# Patient Record
Sex: Male | Born: 1945
Health system: Southern US, Community
[De-identification: ages and names within clinical notes are randomized; demographics above are authoritative.]

## PROBLEM LIST (undated history)

## (undated) DIAGNOSIS — I10 Essential (primary) hypertension: Secondary | ICD-10-CM

---

## 2020-02-19 ENCOUNTER — Encounter (HOSPITAL_BASED_OUTPATIENT_CLINIC_OR_DEPARTMENT_OTHER): Payer: Self-pay | Admitting: Emergency Medicine

## 2020-02-19 ENCOUNTER — Emergency Department (HOSPITAL_BASED_OUTPATIENT_CLINIC_OR_DEPARTMENT_OTHER): Payer: BC Managed Care – PPO

## 2020-02-19 ENCOUNTER — Inpatient Hospital Stay (HOSPITAL_BASED_OUTPATIENT_CLINIC_OR_DEPARTMENT_OTHER)
Admission: EM | Admit: 2020-02-19 | Discharge: 2020-02-23 | DRG: 482 | Disposition: A | Discharge status: Skilled Nursing Facility | Payer: BC Managed Care – PPO | Attending: Internal Medicine | Admitting: Internal Medicine

## 2020-02-19 ENCOUNTER — Other Ambulatory Visit: Payer: Self-pay

## 2020-02-19 DIAGNOSIS — S72142A Displaced intertrochanteric fracture of left femur, initial encounter for closed fracture: Secondary | ICD-10-CM | POA: Diagnosis not present

## 2020-02-19 DIAGNOSIS — S72002A Fracture of unspecified part of neck of left femur, initial encounter for closed fracture: Secondary | ICD-10-CM | POA: Diagnosis not present

## 2020-02-19 DIAGNOSIS — Z8781 Personal history of (healed) traumatic fracture: Secondary | ICD-10-CM

## 2020-02-19 DIAGNOSIS — K5900 Constipation, unspecified: Secondary | ICD-10-CM | POA: Diagnosis not present

## 2020-02-19 DIAGNOSIS — W01198A Fall on same level from slipping, tripping and stumbling with subsequent striking against other object, initial encounter: Secondary | ICD-10-CM | POA: Diagnosis present

## 2020-02-19 DIAGNOSIS — Z419 Encounter for procedure for purposes other than remedying health state, unspecified: Secondary | ICD-10-CM

## 2020-02-19 DIAGNOSIS — I10 Essential (primary) hypertension: Secondary | ICD-10-CM | POA: Diagnosis present

## 2020-02-19 DIAGNOSIS — Y93H2 Activity, gardening and landscaping: Secondary | ICD-10-CM

## 2020-02-19 DIAGNOSIS — Z20822 Contact with and (suspected) exposure to covid-19: Secondary | ICD-10-CM | POA: Diagnosis present

## 2020-02-19 HISTORY — DX: Essential (primary) hypertension: I10

## 2020-02-19 LAB — CBC WITH DIFFERENTIAL/PLATELET
Abs Immature Granulocytes: 0.04 10*3/uL (ref 0.00–0.07)
Basophils Absolute: 0 10*3/uL (ref 0.0–0.1)
Basophils Relative: 0 %
Eosinophils Absolute: 0 10*3/uL (ref 0.0–0.5)
Eosinophils Relative: 0 %
HCT: 43.3 % (ref 39.0–52.0)
Hemoglobin: 14.2 g/dL (ref 13.0–17.0)
Immature Granulocytes: 0 %
Lymphocytes Relative: 8 %
Lymphs Abs: 0.8 10*3/uL (ref 0.7–4.0)
MCH: 31 pg (ref 26.0–34.0)
MCHC: 32.8 g/dL (ref 30.0–36.0)
MCV: 94.5 fL (ref 80.0–100.0)
Monocytes Absolute: 0.5 10*3/uL (ref 0.1–1.0)
Monocytes Relative: 5 %
Neutro Abs: 8.7 10*3/uL — ABNORMAL HIGH (ref 1.7–7.7)
Neutrophils Relative %: 87 %
Platelets: 158 10*3/uL (ref 150–400)
RBC: 4.58 MIL/uL (ref 4.22–5.81)
RDW: 12.4 % (ref 11.5–15.5)
WBC: 10.1 10*3/uL (ref 4.0–10.5)
nRBC: 0 % (ref 0.0–0.2)

## 2020-02-19 LAB — RESPIRATORY PANEL BY RT PCR (FLU A&B, COVID)
Influenza A by PCR: NEGATIVE
Influenza B by PCR: NEGATIVE
SARS Coronavirus 2 by RT PCR: NEGATIVE

## 2020-02-19 LAB — BASIC METABOLIC PANEL
Anion gap: 8 (ref 5–15)
BUN: 17 mg/dL (ref 8–23)
CO2: 27 mmol/L (ref 22–32)
Calcium: 9 mg/dL (ref 8.9–10.3)
Chloride: 106 mmol/L (ref 98–111)
Creatinine, Ser: 1.06 mg/dL (ref 0.61–1.24)
GFR calc Af Amer: >60 mL/min (ref >60)
GFR calc non Af Amer: >60 mL/min (ref >60)
Glucose, Bld: 133 mg/dL — ABNORMAL HIGH (ref 70–99)
Potassium: 4.3 mmol/L (ref 3.5–5.1)
Sodium: 141 mmol/L (ref 135–145)

## 2020-02-19 MED ORDER — NEBIVOLOL HCL 10 MG PO TABS
10.0000 mg | ORAL_TABLET | Freq: Every day | ORAL | Status: DC
  Administered 2020-02-21 – 2020-02-23 (×3): 10 mg via ORAL
  Filled 2020-02-19 (×4): qty 1

## 2020-02-19 MED ORDER — FENTANYL CITRATE (PF) 100 MCG/2ML IJ SOLN
100.0000 ug | Freq: Once | INTRAMUSCULAR | Status: AC
  Administered 2020-02-19: 100 ug via INTRAVENOUS
  Filled 2020-02-19: qty 2

## 2020-02-19 NOTE — Progress Notes (Signed)
Orthopedics consulted due to left intertrochanteric femur fracture found on x-ray. Will plan for transfer from [facility] to [facility] for surgical fixation. Ok to eat this evening, but NPO after midnight. Plan for IM nail with Dr. Dion Saucier tomorrow afternoon at [facility]. Full consult to follow upon arrival to [facility].   Janine Ores, PA-C

## 2020-02-19 NOTE — ED Provider Notes (Signed)
[facility] EMERGENCY DEPARTMENT Provider Note   CSN: [id number] Arrival date & time: 02/19/20  1955     History Chief Complaint  Patient presents with  . Fall    Ovide Dusek is a 74 y.o. male.  74yo M w/ h/o HTN who p/w L hip pain after a fall. This afternoon just PTA, he was pushing a wheelbarrow which fell over and caused him to fall onto his left side. he bumped his head but did not lose consciousness.  He reports moderate, constant pain in his left hip and radiation into his upper thigh.  He denies any head or neck pain or any other injuries from the fall.  No numbness.  He has not been able to walk since the injury.  He denies any anticoagulant use.  The history is provided by the patient.  Fall       Past Medical History:  Diagnosis Date  . Hypertension     Patient Active Problem List   Diagnosis Date Noted  . Closed left hip fracture (HCC) 02/19/2020    History reviewed. No pertinent surgical history.     No family history on file.  Social History   Tobacco Use  . Smoking status: Never Smoker  . Smokeless tobacco: Never Used  Substance Use Topics  . Alcohol use: Never  . Drug use: Never    Home Medications Prior to Admission medications   Medication Sig Start Date End Date Taking? Authorizing Provider  nebivolol (BYSTOLIC) 10 MG tablet Take 10 mg by mouth daily.   Yes [provider]    Allergies    Patient has no known allergies.  Review of Systems   Review of Systems All other systems reviewed and are negative except that which was mentioned in HPI  Physical Exam Updated Vital Signs BP 128/67 (BP Location: Left Arm)   Pulse 78   Temp 97.9 F (36.6 C) (Oral)   Resp 19   Ht 6\' 3"  (1.905 m)   Wt 119.7 kg   SpO2 98%   BMI 33.00 kg/m   Physical Exam Vitals and nursing note reviewed.  Constitutional:      General: He is not in acute distress.    Appearance: He is well-developed.  HENT:     Head: Normocephalic  and atraumatic.  Eyes:     Conjunctiva/sclera: Conjunctivae normal.  Cardiovascular:     Rate and Rhythm: Normal rate and regular rhythm.     Heart sounds: Normal heart sounds. No murmur heard.   Pulmonary:     Effort: Pulmonary effort is normal.     Breath sounds: Normal breath sounds.  Chest:     Chest wall: No tenderness.  Abdominal:     General: Bowel sounds are normal. There is no distension.     Palpations: Abdomen is soft.     Tenderness: There is no abdominal tenderness.  Musculoskeletal:        General: Tenderness present.     Cervical back: Neck supple. No tenderness.     Comments: L hip internally rotated and shortened, 2+ DP pulses, normal distal sensation  Skin:    General: Skin is warm and dry.  Neurological:     Mental Status: He is alert and oriented to person, place, and time.     Comments: Fluent speech  Psychiatric:        Judgment: Judgment normal.     ED Results / Procedures / Treatments   Labs (all labs ordered are  listed, but only abnormal results are displayed) Labs Reviewed  CBC WITH DIFFERENTIAL/PLATELET - Abnormal; Notable for the following components:      Result Value   Neutro Abs 8.7 (*)    All other components within normal limits  RESPIRATORY PANEL BY RT PCR (FLU A&B, COVID)  BASIC METABOLIC PANEL    EKG None  Radiology CT Head Wo Contrast  Result Date: 02/19/2020 CLINICAL DATA:  Fall while unloading wheelbarrow EXAM: CT HEAD WITHOUT CONTRAST TECHNIQUE: Contiguous axial images were obtained from the base of the skull through the vertex without intravenous contrast. COMPARISON:  None. FINDINGS: Brain: No evidence of acute infarction, hemorrhage, hydrocephalus, extra-axial collection, visible mass lesion or mass effect. Symmetric prominence of the ventricles, cisterns and sulci compatible with parenchymal volume loss. Patchy areas of white matter hypoattenuation are most compatible with chronic microvascular angiopathy. Vascular:  Atherosclerotic calcification of the carotid siphons. No hyperdense vessel. Skull: No calvarial fracture or suspicious osseous lesion. No scalp swelling or hematoma. Sinuses/Orbits: Paranasal sinuses and mastoid air cells are predominantly clear. Included orbital structures are unremarkable. Other: None. IMPRESSION: 1. No acute intracranial abnormality. No scalp swelling or calvarial fracture. 2. Mild parenchymal volume loss and chronic microvascular angiopathy changes. Electronically Signed   By: Kreg Shropshire M.D.   On: 02/19/2020 22:26   DG Chest Port 1 View  Result Date: 02/19/2020 CLINICAL DATA:  Hip fracture after a fall EXAM: PORTABLE CHEST 1 VIEW COMPARISON:  None. FINDINGS: Shallow inspiration. Heart size and pulmonary vascularity are normal for technique. Vascular crowding or linear atelectasis in the lung bases. No consolidation or edema. No pleural effusions. No pneumothorax. Mediastinal contours appear intact. IMPRESSION: Shallow inspiration with vascular crowding or linear atelectasis in the lung bases. Electronically Signed   By: Burman Nieves M.D.   On: 02/19/2020 23:02   DG Hip Unilat With Pelvis 2-3 Views Left  Result Date: 02/19/2020 CLINICAL DATA:  Post fall with left hip pain. EXAM: DG HIP (WITH OR WITHOUT PELVIS) 2-3V LEFT COMPARISON:  None. FINDINGS: Displaced intertrochanteric left femur fracture. Fracture involves the greater trochanter with mild combination. There is proximal migration of the femoral shaft. Femoral head remains seated. Pubic rami are intact. No additional fracture of the pelvis. IMPRESSION: Displaced intertrochanteric left femur fracture. Electronically Signed   By: Narda Rutherford M.D.   On: 02/19/2020 22:28    Procedures Procedures (including critical care time)  Medications Ordered in ED Medications  nebivolol (BYSTOLIC) tablet 10 mg (has no administration in time range)  fentaNYL (SUBLIMAZE) injection 100 mcg (100 mcg Intravenous Given 02/19/20 2311)      ED Course  I have reviewed the triage vital signs and the nursing notes.  Pertinent labs & imaging results that were available during my care of the patient were reviewed by me and considered in my medical decision making (see chart for details).    MDM Rules/Calculators/A&P                          Neurovascularly intact distally.  Plain films show displaced intertrochanteric left femur fracture.  Discussed injury with orthopedics, Dr. Dion Saucier. They will likely plan to repair operatively tomorrow afternoon. Discussed admission at [facility] with [facility] hospitalist, Dr. Toniann Fail. Pt in agreement with plan.  Final Clinical Impression(s) / ED Diagnoses Final diagnoses:  None    Rx / DC Orders ED Discharge Orders    None       Tywaun Hiltner, Ambrose Finland, MD 02/19/20 2325

## 2020-02-19 NOTE — ED Triage Notes (Addendum)
Arrived via EMS, fell in drive way about 45 min ago, while unloading a wheelbarrow . BP 170/90, HR 90, R 18, O2 sat 98% RA. Having pain to left thigh area . No LOC

## 2020-02-20 ENCOUNTER — Inpatient Hospital Stay (HOSPITAL_COMMUNITY): Payer: BC Managed Care – PPO

## 2020-02-20 ENCOUNTER — Inpatient Hospital Stay (HOSPITAL_COMMUNITY): Payer: BC Managed Care – PPO | Admitting: Certified Registered Nurse Anesthetist

## 2020-02-20 ENCOUNTER — Encounter (HOSPITAL_COMMUNITY): Payer: Self-pay | Admitting: Internal Medicine

## 2020-02-20 ENCOUNTER — Encounter (HOSPITAL_COMMUNITY)
Admission: EM | Disposition: A | Discharge status: Skilled Nursing Facility | Payer: Self-pay | Source: Home / Self Care | Attending: Internal Medicine

## 2020-02-20 DIAGNOSIS — S72002A Fracture of unspecified part of neck of left femur, initial encounter for closed fracture: Secondary | ICD-10-CM | POA: Diagnosis present

## 2020-02-20 DIAGNOSIS — Z20822 Contact with and (suspected) exposure to covid-19: Secondary | ICD-10-CM | POA: Diagnosis present

## 2020-02-20 DIAGNOSIS — W01198A Fall on same level from slipping, tripping and stumbling with subsequent striking against other object, initial encounter: Secondary | ICD-10-CM | POA: Diagnosis present

## 2020-02-20 DIAGNOSIS — K5900 Constipation, unspecified: Secondary | ICD-10-CM | POA: Diagnosis not present

## 2020-02-20 DIAGNOSIS — I10 Essential (primary) hypertension: Secondary | ICD-10-CM | POA: Diagnosis present

## 2020-02-20 DIAGNOSIS — Y93H2 Activity, gardening and landscaping: Secondary | ICD-10-CM | POA: Diagnosis not present

## 2020-02-20 DIAGNOSIS — S72142A Displaced intertrochanteric fracture of left femur, initial encounter for closed fracture: Secondary | ICD-10-CM | POA: Diagnosis present

## 2020-02-20 HISTORY — PX: INTRAMEDULLARY (IM) NAIL INTERTROCHANTERIC: SHX5875

## 2020-02-20 LAB — TYPE AND SCREEN
ABO/RH(D): A POS
Antibody Screen: NEGATIVE

## 2020-02-20 LAB — ABO/RH: ABO/RH(D): A POS

## 2020-02-20 SURGERY — FIXATION, FRACTURE, INTERTROCHANTERIC, WITH INTRAMEDULLARY ROD
Anesthesia: Spinal | Laterality: Left

## 2020-02-20 MED ORDER — FENTANYL CITRATE (PF) 100 MCG/2ML IJ SOLN
INTRAMUSCULAR | Status: AC
  Filled 2020-02-20: qty 2

## 2020-02-20 MED ORDER — PHENYLEPHRINE HCL (PRESSORS) 10 MG/ML IV SOLN
INTRAVENOUS | Status: AC
  Filled 2020-02-20: qty 1

## 2020-02-20 MED ORDER — BISACODYL 10 MG RE SUPP: 10.0000 mg | Freq: Every day | RECTAL | Status: DC | PRN

## 2020-02-20 MED ORDER — CEFAZOLIN SODIUM-DEXTROSE 2-4 GM/100ML-% IV SOLN
2.0000 g | Freq: Four times a day (QID) | INTRAVENOUS | Status: AC
  Administered 2020-02-20 – 2020-02-21 (×2): 2 g via INTRAVENOUS
  Filled 2020-02-20 (×2): qty 100

## 2020-02-20 MED ORDER — HYDROMORPHONE HCL 1 MG/ML IJ SOLN: 0.2500 mg | INTRAMUSCULAR | Status: DC | PRN

## 2020-02-20 MED ORDER — ENOXAPARIN SODIUM 40 MG/0.4ML ~~LOC~~ SOLN: 40.0000 mg | SUBCUTANEOUS | 0 refills | Status: DC

## 2020-02-20 MED ORDER — PROPOFOL 1000 MG/100ML IV EMUL
INTRAVENOUS | Status: AC
  Filled 2020-02-20: qty 100

## 2020-02-20 MED ORDER — CHLORHEXIDINE GLUCONATE 4 % EX LIQD: 60.0000 mL | Freq: Once | CUTANEOUS | Status: DC

## 2020-02-20 MED ORDER — CEFAZOLIN SODIUM-DEXTROSE 2-4 GM/100ML-% IV SOLN
2.0000 g | INTRAVENOUS | Status: AC
  Administered 2020-02-20: 2 g via INTRAVENOUS

## 2020-02-20 MED ORDER — PROPOFOL 500 MG/50ML IV EMUL
INTRAVENOUS | Status: DC | PRN
  Administered 2020-02-20: 100 ug/kg/min via INTRAVENOUS

## 2020-02-20 MED ORDER — MENTHOL 3 MG MT LOZG: 1.0000 | LOZENGE | OROMUCOSAL | Status: DC | PRN

## 2020-02-20 MED ORDER — ACETAMINOPHEN 160 MG/5ML PO SOLN: 325.0000 mg | Freq: Once | ORAL | Status: DC | PRN

## 2020-02-20 MED ORDER — ACETAMINOPHEN 500 MG PO TABS
ORAL_TABLET | ORAL | Status: AC
  Administered 2020-02-20: 1000 mg via ORAL
  Filled 2020-02-20: qty 2

## 2020-02-20 MED ORDER — MEPERIDINE HCL 50 MG/ML IJ SOLN: 6.2500 mg | INTRAMUSCULAR | Status: DC | PRN

## 2020-02-20 MED ORDER — MAGNESIUM CITRATE PO SOLN: 1.0000 | Freq: Once | ORAL | Status: DC | PRN

## 2020-02-20 MED ORDER — PROPOFOL 10 MG/ML IV BOLUS
INTRAVENOUS | Status: AC
  Filled 2020-02-20: qty 20

## 2020-02-20 MED ORDER — EPHEDRINE 5 MG/ML INJ
INTRAVENOUS | Status: AC
  Filled 2020-02-20: qty 10

## 2020-02-20 MED ORDER — PROPOFOL 500 MG/50ML IV EMUL
INTRAVENOUS | Status: AC
  Filled 2020-02-20: qty 50

## 2020-02-20 MED ORDER — POLYETHYLENE GLYCOL 3350 17 G PO PACK: 17.0000 g | PACK | Freq: Every day | ORAL | Status: DC | PRN

## 2020-02-20 MED ORDER — ACETAMINOPHEN 500 MG PO TABS
500.0000 mg | ORAL_TABLET | Freq: Four times a day (QID) | ORAL | Status: AC
  Administered 2020-02-20 – 2020-02-21 (×4): 500 mg via ORAL
  Filled 2020-02-20 (×4): qty 1

## 2020-02-20 MED ORDER — LACTATED RINGERS IV SOLN: INTRAVENOUS | Status: DC

## 2020-02-20 MED ORDER — DEXAMETHASONE SODIUM PHOSPHATE 10 MG/ML IJ SOLN
INTRAMUSCULAR | Status: AC
  Filled 2020-02-20: qty 1

## 2020-02-20 MED ORDER — PHENOL 1.4 % MT LIQD: 1.0000 | OROMUCOSAL | Status: DC | PRN

## 2020-02-20 MED ORDER — FERROUS SULFATE 325 (65 FE) MG PO TABS
325.0000 mg | ORAL_TABLET | Freq: Three times a day (TID) | ORAL | Status: DC
  Administered 2020-02-20 – 2020-02-23 (×9): 325 mg via ORAL
  Filled 2020-02-20 (×9): qty 1

## 2020-02-20 MED ORDER — ACETAMINOPHEN 500 MG PO TABS: 1000.0000 mg | ORAL_TABLET | Freq: Once | ORAL | Status: AC

## 2020-02-20 MED ORDER — 0.9 % SODIUM CHLORIDE (POUR BTL) OPTIME
TOPICAL | Status: DC | PRN
  Administered 2020-02-20: 1000 mL

## 2020-02-20 MED ORDER — MORPHINE SULFATE (PF) 2 MG/ML IV SOLN: 0.5000 mg | INTRAVENOUS | Status: DC | PRN

## 2020-02-20 MED ORDER — PROPOFOL 10 MG/ML IV BOLUS
INTRAVENOUS | Status: DC | PRN
  Administered 2020-02-20 (×3): 20 mg via INTRAVENOUS

## 2020-02-20 MED ORDER — DEXAMETHASONE SODIUM PHOSPHATE 10 MG/ML IJ SOLN
INTRAMUSCULAR | Status: DC | PRN
  Administered 2020-02-20: 4 mg via INTRAVENOUS

## 2020-02-20 MED ORDER — CEFAZOLIN SODIUM-DEXTROSE 2-4 GM/100ML-% IV SOLN
INTRAVENOUS | Status: AC
  Filled 2020-02-20: qty 100

## 2020-02-20 MED ORDER — HYDROCODONE-ACETAMINOPHEN 7.5-325 MG PO TABS
1.0000 | ORAL_TABLET | ORAL | Status: DC | PRN
  Administered 2020-02-21 – 2020-02-22 (×3): 2 via ORAL
  Administered 2020-02-23 (×2): 1 via ORAL
  Filled 2020-02-20: qty 2
  Filled 2020-02-20 (×2): qty 1
  Filled 2020-02-20 (×2): qty 2

## 2020-02-20 MED ORDER — ACETAMINOPHEN 325 MG PO TABS: 325.0000 mg | ORAL_TABLET | Freq: Once | ORAL | Status: DC | PRN

## 2020-02-20 MED ORDER — FENTANYL CITRATE (PF) 100 MCG/2ML IJ SOLN
INTRAMUSCULAR | Status: DC | PRN
Start: 2020-02-20 — End: 2020-02-20
  Administered 2020-02-20: 100 ug via INTRAVENOUS

## 2020-02-20 MED ORDER — LIDOCAINE 2% (20 MG/ML) 5 ML SYRINGE
INTRAMUSCULAR | Status: AC
  Filled 2020-02-20: qty 5

## 2020-02-20 MED ORDER — PHENYLEPHRINE 40 MCG/ML (10ML) SYRINGE FOR IV PUSH (FOR BLOOD PRESSURE SUPPORT)
PREFILLED_SYRINGE | INTRAVENOUS | Status: DC | PRN
  Administered 2020-02-20: 80 ug via INTRAVENOUS

## 2020-02-20 MED ORDER — ONDANSETRON HCL 4 MG/2ML IJ SOLN
INTRAMUSCULAR | Status: AC
  Filled 2020-02-20: qty 2

## 2020-02-20 MED ORDER — METHOCARBAMOL 500 MG PO TABS
500.0000 mg | ORAL_TABLET | Freq: Four times a day (QID) | ORAL | Status: DC | PRN
  Administered 2020-02-22 – 2020-02-23 (×2): 500 mg via ORAL
  Filled 2020-02-20 (×2): qty 1

## 2020-02-20 MED ORDER — AMISULPRIDE (ANTIEMETIC) 5 MG/2ML IV SOLN: 10.0000 mg | Freq: Once | INTRAVENOUS | Status: DC | PRN

## 2020-02-20 MED ORDER — ACETAMINOPHEN 325 MG PO TABS: 325.0000 mg | ORAL_TABLET | Freq: Four times a day (QID) | ORAL | Status: DC | PRN

## 2020-02-20 MED ORDER — DOCUSATE SODIUM 100 MG PO CAPS
100.0000 mg | ORAL_CAPSULE | Freq: Two times a day (BID) | ORAL | Status: DC
  Administered 2020-02-20 – 2020-02-23 (×6): 100 mg via ORAL
  Filled 2020-02-20 (×6): qty 1

## 2020-02-20 MED ORDER — ONDANSETRON HCL 4 MG/2ML IJ SOLN: 4.0000 mg | Freq: Four times a day (QID) | INTRAMUSCULAR | Status: DC | PRN

## 2020-02-20 MED ORDER — ENOXAPARIN SODIUM 40 MG/0.4ML ~~LOC~~ SOLN: 40.0000 mg | SUBCUTANEOUS | Status: DC

## 2020-02-20 MED ORDER — ACETAMINOPHEN 10 MG/ML IV SOLN: 1000.0000 mg | Freq: Once | INTRAVENOUS | Status: DC | PRN

## 2020-02-20 MED ORDER — ENSURE PRE-SURGERY PO LIQD
296.0000 mL | Freq: Once | ORAL | Status: DC
  Filled 2020-02-20: qty 296

## 2020-02-20 MED ORDER — ALUM & MAG HYDROXIDE-SIMETH 200-200-20 MG/5ML PO SUSP: 30.0000 mL | ORAL | Status: DC | PRN

## 2020-02-20 MED ORDER — POTASSIUM CHLORIDE IN NACL 20-0.45 MEQ/L-% IV SOLN
INTRAVENOUS | Status: DC
  Filled 2020-02-20: qty 1000

## 2020-02-20 MED ORDER — EPHEDRINE SULFATE-NACL 50-0.9 MG/10ML-% IV SOSY
PREFILLED_SYRINGE | INTRAVENOUS | Status: DC | PRN
  Administered 2020-02-20 (×2): 10 mg via INTRAVENOUS

## 2020-02-20 MED ORDER — BUPIVACAINE IN DEXTROSE 0.75-8.25 % IT SOLN
INTRATHECAL | Status: DC | PRN
  Administered 2020-02-20: 1.7 mL via INTRATHECAL

## 2020-02-20 MED ORDER — ONDANSETRON HCL 4 MG PO TABS: 4.0000 mg | ORAL_TABLET | Freq: Four times a day (QID) | ORAL | Status: DC | PRN

## 2020-02-20 MED ORDER — HYDROCODONE-ACETAMINOPHEN 5-325 MG PO TABS: 1.0000 | ORAL_TABLET | Freq: Four times a day (QID) | ORAL | Status: DC | PRN

## 2020-02-20 MED ORDER — MORPHINE SULFATE (PF) 4 MG/ML IV SOLN
4.0000 mg | Freq: Once | INTRAVENOUS | Status: AC
  Administered 2020-02-20: 4 mg via INTRAVENOUS
  Filled 2020-02-20: qty 1

## 2020-02-20 MED ORDER — PHENYLEPHRINE HCL-NACL 10-0.9 MG/250ML-% IV SOLN
INTRAVENOUS | Status: DC | PRN
  Administered 2020-02-20: 50 ug/min via INTRAVENOUS

## 2020-02-20 MED ORDER — POVIDONE-IODINE 10 % EX SWAB
2.0000 "application " | Freq: Once | CUTANEOUS | Status: AC
  Administered 2020-02-20: 2 via TOPICAL

## 2020-02-20 MED ORDER — HYDROCODONE-ACETAMINOPHEN 5-325 MG PO TABS
1.0000 | ORAL_TABLET | ORAL | Status: DC | PRN
  Administered 2020-02-21: 1 via ORAL
  Filled 2020-02-20: qty 1

## 2020-02-20 MED ORDER — SENNA 8.6 MG PO TABS
1.0000 | ORAL_TABLET | Freq: Two times a day (BID) | ORAL | Status: DC
  Administered 2020-02-20 – 2020-02-21 (×2): 8.6 mg via ORAL
  Filled 2020-02-20 (×2): qty 1

## 2020-02-20 MED ORDER — LACTATED RINGERS IV SOLN: INTRAVENOUS | Status: DC | PRN

## 2020-02-20 MED ORDER — DOCUSATE SODIUM 100 MG PO CAPS: 100.0000 mg | ORAL_CAPSULE | Freq: Two times a day (BID) | ORAL | Status: DC

## 2020-02-20 MED ORDER — LIDOCAINE HCL (CARDIAC) PF 100 MG/5ML IV SOSY
PREFILLED_SYRINGE | INTRAVENOUS | Status: DC | PRN
  Administered 2020-02-20: 60 mg via INTRAVENOUS

## 2020-02-20 MED ORDER — ENOXAPARIN SODIUM 40 MG/0.4ML ~~LOC~~ SOLN
40.0000 mg | SUBCUTANEOUS | Status: DC
  Administered 2020-02-21 – 2020-02-23 (×3): 40 mg via SUBCUTANEOUS
  Filled 2020-02-20 (×3): qty 0.4

## 2020-02-20 MED ORDER — PHENYLEPHRINE 40 MCG/ML (10ML) SYRINGE FOR IV PUSH (FOR BLOOD PRESSURE SUPPORT)
PREFILLED_SYRINGE | INTRAVENOUS | Status: AC
  Filled 2020-02-20: qty 20

## 2020-02-20 MED ORDER — ONDANSETRON HCL 4 MG/2ML IJ SOLN
INTRAMUSCULAR | Status: DC | PRN
  Administered 2020-02-20: 4 mg via INTRAVENOUS

## 2020-02-20 MED ORDER — METHOCARBAMOL 1000 MG/10ML IJ SOLN
500.0000 mg | Freq: Four times a day (QID) | INTRAVENOUS | Status: DC | PRN
  Filled 2020-02-20: qty 5

## 2020-02-20 SURGICAL SUPPLY — 38 items
BENZOIN TINCTURE PRP APPL 2/3 (GAUZE/BANDAGES/DRESSINGS) ×3 IMPLANT
BIT DRILL CANN LG 4.3MM (BIT) ×1 IMPLANT
BNDG COHESIVE 6X5 TAN STRL LF (GAUZE/BANDAGES/DRESSINGS) ×3 IMPLANT
CLOSURE STERI-STRIP 1/2X4 (GAUZE/BANDAGES/DRESSINGS) ×1
CLSR STERI-STRIP ANTIMIC 1/2X4 (GAUZE/BANDAGES/DRESSINGS) ×2 IMPLANT
COVER PERINEAL POST (MISCELLANEOUS) ×3 IMPLANT
COVER SURGICAL LIGHT HANDLE (MISCELLANEOUS) ×3 IMPLANT
COVER WAND RF STERILE (DRAPES) ×3 IMPLANT
DRAPE STERI IOBAN 125X83 (DRAPES) ×3 IMPLANT
DRILL BIT CANN LG 4.3MM (BIT) ×3
DRSG MEPILEX BORDER 4X4 (GAUZE/BANDAGES/DRESSINGS) ×6 IMPLANT
DRSG MEPILEX BORDER 4X8 (GAUZE/BANDAGES/DRESSINGS) IMPLANT
DRSG PAD ABDOMINAL 8X10 ST (GAUZE/BANDAGES/DRESSINGS) ×3 IMPLANT
DURAPREP 26ML APPLICATOR (WOUND CARE) ×3 IMPLANT
ELECT REM PT RETURN 15FT ADLT (MISCELLANEOUS) ×3 IMPLANT
GAUZE XEROFORM 5X9 LF (GAUZE/BANDAGES/DRESSINGS) ×3 IMPLANT
GLOVE BIO SURGEON STRL SZ 6.5 (GLOVE) ×4 IMPLANT
GLOVE BIO SURGEON STRL SZ7.5 (GLOVE) ×3 IMPLANT
GLOVE BIO SURGEONS STRL SZ 6.5 (GLOVE) ×2
GLOVE BIOGEL PI IND STRL 7.0 (GLOVE) ×1 IMPLANT
GLOVE BIOGEL PI INDICATOR 7.0 (GLOVE) ×2
GLOVE INDICATOR 8.0 STRL GRN (GLOVE) ×3 IMPLANT
GOWN STRL REUS W/ TWL LRG LVL3 (GOWN DISPOSABLE) ×2 IMPLANT
GOWN STRL REUS W/TWL LRG LVL3 (GOWN DISPOSABLE) ×4
GUIDEPIN VERSANAIL DSP 3.2X444 (ORTHOPEDIC DISPOSABLE SUPPLIES) ×3 IMPLANT
HFN LAG SCREW 10.5MM X 115MM (Orthopedic Implant) ×3 IMPLANT
MANIFOLD NEPTUNE II (INSTRUMENTS) ×3 IMPLANT
NAIL HIP FRACT 130D 11X180 (Screw) ×3 IMPLANT
NS IRRIG 1000ML POUR BTL (IV SOLUTION) ×3 IMPLANT
PACK GENERAL/GYN (CUSTOM PROCEDURE TRAY) ×3 IMPLANT
PAD ARMBOARD 7.5X6 YLW CONV (MISCELLANEOUS) ×6 IMPLANT
SCREW BONE CORTICAL 5.0X36 (Screw) ×3 IMPLANT
SUT VIC AB 0 CT1 27 (SUTURE) ×2
SUT VIC AB 0 CT1 27XBRD ANBCTR (SUTURE) ×1 IMPLANT
SUT VIC AB 3-0 SH 27 (SUTURE) ×4
SUT VIC AB 3-0 SH 27X BRD (SUTURE) ×2 IMPLANT
TOWEL OR 17X26 10 PK STRL BLUE (TOWEL DISPOSABLE) ×3 IMPLANT
WATER STERILE IRR 1000ML POUR (IV SOLUTION) ×6 IMPLANT

## 2020-02-20 NOTE — Anesthesia Preprocedure Evaluation (Addendum)
Anesthesia Evaluation  Patient identified by MRN, date of birth, ID band Patient awake    Reviewed: Allergy & Precautions, NPO status , Patient's Chart, lab work & pertinent test results  Airway Mallampati: II  TM Distance: >3 FB Neck ROM: Full    Dental  (+) Edentulous Upper, Edentulous Lower   Pulmonary neg pulmonary ROS,    breath sounds clear to auscultation       Cardiovascular hypertension, Pt. on home beta blockers  Rhythm:Regular Rate:Normal     Neuro/Psych negative neurological ROS  negative psych ROS   GI/Hepatic negative GI ROS, Neg liver ROS,   Endo/Other  negative endocrine ROS  Renal/GU negative Renal ROS     Musculoskeletal negative musculoskeletal ROS (+)   Abdominal Normal abdominal exam  (+)   Peds  Hematology negative hematology ROS (+)   Anesthesia Other Findings   Reproductive/Obstetrics                            Anesthesia Physical Anesthesia Plan  ASA: II  Anesthesia Plan: Spinal   Post-op Pain Management:    Induction: Intravenous  PONV Risk Score and Plan: 2 and Ondansetron and Propofol infusion  Airway Management Planned: Natural Airway and Simple Face Mask  Additional Equipment: None  Intra-op Plan:   Post-operative Plan:   Informed Consent: I have reviewed the patients History and Physical, chart, labs and discussed the procedure including the risks, benefits and alternatives for the proposed anesthesia with the patient or authorized representative who has indicated his/her understanding and acceptance.       Plan Discussed with: CRNA  Anesthesia Plan Comments: (Lab Results      Component                Value               Date                      WBC                      10.1                02/19/2020                HGB                      14.2                02/19/2020                HCT                      43.3                 02/19/2020                MCV                      94.5                02/19/2020                PLT                      158  02/19/2020           )       Anesthesia Quick Evaluation

## 2020-02-20 NOTE — Consult Note (Signed)
ORTHOPAEDIC CONSULTATION  REQUESTING PHYSICIAN: Teddy Spike, DO  Chief Complaint: Left hip pain after fall  HPI: Spencer [name] is a 74 y.o. male who complains of left hip pain after mechanical fall yesterday onto concrete.  He was brought to med [facility] and then transferred to [place] [facility].  Pain is worse with movement, better with rest, located around the left hip.  Denies any other injuries.  Previously denies any pain in the hip.  He lives with his sister who can help take care of him.  Pain is rated as severe.  Past Medical History:  Diagnosis Date  . Hypertension    History reviewed. No pertinent surgical history. Social History   Socioeconomic History  . Marital status: Single    Spouse name: Not on file  . Number of children: Not on file  . Years of education: Not on file  . Highest education level: Not on file  Occupational History  . Not on file  Tobacco Use  . Smoking status: Never Smoker  . Smokeless tobacco: Never Used  Substance and Sexual Activity  . Alcohol use: Never  . Drug use: Never  . Sexual activity: Not on file  Other Topics Concern  . Not on file  Social History Narrative  . Not on file   Social Determinants of Health   Financial Resource Strain:   . Difficulty of Paying Living Expenses: Not on file  Food Insecurity:   . Worried About Programme researcher, broadcasting/film/video in the Last Year: Not on file  . Ran Out of Food in the Last Year: Not on file  Transportation Needs:   . Lack of Transportation (Medical): Not on file  . Lack of Transportation (Non-Medical): Not on file  Physical Activity:   . Days of Exercise per Week: Not on file  . Minutes of Exercise per Session: Not on file  Stress:   . Feeling of Stress : Not on file  Social Connections:   . Frequency of Communication with Friends and Family: Not on file  . Frequency of Social Gatherings with Friends and Family: Not on file  . Attends Religious Services: Not on file  . Active Member of  Clubs or Organizations: Not on file  . Attends Banker Meetings: Not on file  . Marital Status: Not on file   History reviewed. No pertinent family history. No Known Allergies   Positive ROS: All other systems have been reviewed and were otherwise negative with the exception of those mentioned in the HPI and as above.  Physical Exam: General: Alert, no acute distress Cardiovascular: No pedal edema Respiratory: No cyanosis, no use of accessory musculature GI: No organomegaly, abdomen is soft and non-tender Skin: No lesions in the area of chief complaint Neurologic: Sensation intact distally Psychiatric: Patient is competent for consent with normal mood and affect Lymphatic: No axillary or cervical lymphadenopathy  MUSCULOSKELETAL: Left hip EHL and FHL are intact.  Sensation intact distally, positive logroll  Assessment: Active Problems:   Closed left hip fracture (HCC)   Plan: Left hip intramedullary nail fixation.  The risks benefits and alternatives were discussed with the patient including but not limited to the risks of nonoperative treatment, versus surgical intervention including infection, bleeding, nerve injury, malunion, nonunion, the need for revision surgery, hardware prominence, hardware failure, the need for hardware removal, blood clots, cardiopulmonary complications, morbidity, mortality, among others, and they were willing to proceed.      Eulas Post, MD Cell (  [phone]   02/20/2020 1:56 PM

## 2020-02-20 NOTE — Anesthesia Postprocedure Evaluation (Signed)
Anesthesia Post Note  Patient: Farley Crooker  Procedure(s) Performed: INTRAMEDULLARY (IM) NAIL INTERTROCHANTRIC (Left )     Patient location during evaluation: PACU Anesthesia Type: Spinal Level of consciousness: oriented and awake and alert Pain management: pain level controlled Vital Signs Assessment: post-procedure vital signs reviewed and stable Respiratory status: spontaneous breathing, respiratory function stable and patient connected to nasal cannula oxygen Cardiovascular status: blood pressure returned to baseline and stable Postop Assessment: no headache, no backache, no apparent nausea or vomiting and spinal receding Anesthetic complications: no   No complications documented.  Last Vitals:  Vitals:   02/20/20 1654 02/20/20 1705  BP: 118/69 116/67  Pulse: (!) 50 (!) 49  Resp: (!) 23 16  Temp: (!) 36.3 C 36.5 C  SpO2: 96% 96%    Last Pain:  Vitals:   02/20/20 1705  TempSrc: Oral  PainSc:                  Shelton Silvas

## 2020-02-20 NOTE — Op Note (Signed)
DATE OF SURGERY:  02/20/2020  TIME: 3:29 PM  PATIENT NAME:  Spencer [name]  AGE: 74 y.o.  PRE-OPERATIVE DIAGNOSIS:  Left intertrochanteric hip fracture  POST-OPERATIVE DIAGNOSIS:  SAME  PROCEDURE:  INTRAMEDULLARY (IM) NAIL INTERTROCHANTRIC  SURGEON:  Eulas Post  ASSISTANT:  Janine Ores, PA-C, present and scrubbed throughout the case, critical for assistance with exposure, retraction, instrumentation, and closure.  ANESTHESIA: Spinal  OPERATIVE IMPLANTS: Biomet Affixus size short 11 femoral nail with a cephalomedullary lag screw for the femoral head, and a size 36 distal interlocking bolt.  UNIQUE ASPECTS OF THE CASE:  The head wanted to be in varus, but ultimately had center center position with more traction.    ESTIMATED BLOOD LOSS:  PREOPERATIVE INDICATIONS:  Spencer [name] is a 74 y.o. year old who fell and suffered a hip fracture. He was brought into the ER and then admitted and optimized and then elected for surgical intervention.    The risks benefits and alternatives were discussed with the patient including but not limited to the risks of nonoperative treatment, versus surgical intervention including infection, bleeding, nerve injury, malunion, nonunion, hardware prominence, hardware failure, need for hardware removal, blood clots, cardiopulmonary complications, morbidity, mortality, among others, and they were willing to proceed.    OPERATIVE PROCEDURE:  The patient was brought to the operating room and placed in the supine position. Anesthesia was administered. He was placed on the fracture table.  Closed reduction was performed under C-arm guidance.  Time out was then performed after sterile prep and drape. He received preoperative antibiotics.  Incision was made proximal to the greater trochanter. A guidewire was placed in the appropriate position. Confirmation was made on AP and lateral views.  The above-named nail was opened. I opened the proximal femur with  a reamer. I then placed the nail by hand down. I did not need to ream the femur.  Once the nail was completely seated, I placed a guidepin into the femoral head into the center center position. I measured the length, and then reamed the lateral cortex and up into the head. I then placed the cephalomedullary screw. Slight compression was applied. Anatomic fixation achieved. Bone quality was mediocre.  I then secured the proximal interlocking bolt, and locked the nail distally using the jig.  I took final C-arm pictures AP and lateral.   Anatomic reconstruction was achieved, and the wounds were irrigated copiously and closed with Vicryl followed by Steri-Strips and sterile gauze for the skin. The patient was awakened and returned to PACU in stable and satisfactory condition. There were no complications and the patient tolerated the procedure well.  He will be weightbearing as tolerated, and will be on Lovenox  for a period of four weeks after discharge.   Teryl Lucy, M.D.

## 2020-02-20 NOTE — ED Notes (Signed)
Per patient request, Spoke with Lawernce Keas, sister of patient, to update that pt is being transported at this time to [facility]

## 2020-02-20 NOTE — Anesthesia Procedure Notes (Signed)
Spinal  Patient location during procedure: OR Start time: 02/20/2020 2:09 PM End time: 02/20/2020 2:11 PM Staffing Performed: anesthesiologist  Anesthesiologist: Shelton Silvas, MD Preanesthetic Checklist Completed: patient identified, IV checked, site marked, risks and benefits discussed, surgical consent, monitors and equipment checked, pre-op evaluation and timeout performed Spinal Block Patient position: left lateral decubitus Prep: DuraPrep and site prepped and draped Location: L3-4 Injection technique: single-shot Needle Needle type: Pencan  Needle gauge: 24 G Needle length: 10 cm Needle insertion depth: 10 cm Additional Notes Patient tolerated well. No immediate complications.

## 2020-02-20 NOTE — H&P (Signed)
History and Physical    Steffen Hase GGY:[id number] DOB: [birth year] DOA: 02/19/2020  PCP: [facility]  Patient coming from: Home  Chief Complaint: fall, hip pain  HPI: Tarek Cravens is a 74 y.o. male with medical history significant of HTN. Presents with hip pain after falls. He states he was loading his wheelbarrow yesterday. As he threw a bag into the wheelbarrow, it overturned and pulled him with it. In the process he landed on his left side and immediately had pain. He denies LOC, but did bump his head. He remembers the entire fall. He reported inability to walk since the fall. He denies any additional alleviating or aggravating factors. He called for transport to [facility] ED.   ED Course: He was found to have an intertrochanteric fracture. Ortho was consulted and rec'd transfer to [facility]. [facility] was called for admission.   Review of Systems: Denies CP, palpitations, dyspnea, syncopal episodes, seizure-like episodes, HA, visual changes, dizziness. Review of systems is otherwise negative for all not mentioned in HPI.   Past Medical History:  Diagnosis Date  . Hypertension     History reviewed. No pertinent surgical history.   reports that he has never smoked. He has never used smokeless tobacco. He reports that he does not drink alcohol and does not use drugs.  No Known Allergies  No family history on file.  Prior to Admission medications   Medication Sig Start Date End Date Taking? Authorizing Provider  nebivolol (BYSTOLIC) 10 MG tablet Take 10 mg by mouth daily.   Yes [provider]    Physical Exam: Vitals:   02/20/20 0805 02/20/20 0900 02/20/20 0932 02/20/20 1035  BP: (!) 141/76  134/78 (!) 149/84  Pulse: 64 69 70 71  Resp: 16  20 16   Temp:   98.7 F (37.1 C)   TempSrc:      SpO2: 98% 98% 96% 97%  Weight:      Height:        General: 74 y.o. male resting in bed in NAD Eyes: PERRL, normal sclera ENMT: Nares patent w/o discharge, orophaynx clear,  dentition normal, ears w/o discharge/lesions/ulcers Neck: Supple, trachea midline Cardiovascular: RRR, +S1, S2, no m/g/r, equal pulses throughout Respiratory: CTABL, no w/r/r, normal WOB GI: BS+, NDNT, no masses noted, no organomegaly noted MSK: No e/c/c, limited ROM LLE owing to hip pain Skin: No rashes, bruises, ulcerations noted Neuro: A&O x 3, no focal deficits Psyc: Appropriate interaction and affect, calm/cooperative  Labs on Admission: I have personally reviewed following labs and imaging studies  CBC: Recent Labs  Lab 02/19/20 2259  WBC 10.1  NEUTROABS 8.7*  HGB 14.2  HCT 43.3  MCV 94.5  PLT 158   Basic Metabolic Panel: Recent Labs  Lab 02/19/20 2259  NA 141  K 4.3  CL 106  CO2 27  GLUCOSE 133*  BUN 17  CREATININE 1.06  CALCIUM 9.0   GFR: Estimated Creatinine Clearance: 87.9 mL/min (by C-G formula based on SCr of 1.06 mg/dL). Liver Function Tests: No results for input(s): AST, ALT, ALKPHOS, BILITOT, PROT, ALBUMIN in the last 168 hours. No results for input(s): LIPASE, AMYLASE in the last 168 hours. No results for input(s): AMMONIA in the last 168 hours. Coagulation Profile: No results for input(s): INR, PROTIME in the last 168 hours. Cardiac Enzymes: No results for input(s): CKTOTAL, CKMB, CKMBINDEX, TROPONINI in the last 168 hours. BNP (last 3 results) No results for input(s): PROBNP in the last 8760 hours. HbA1C: No results for input(s):  HGBA1C in the last 72 hours. CBG: No results for input(s): GLUCAP in the last 168 hours. Lipid Profile: No results for input(s): CHOL, HDL, LDLCALC, TRIG, CHOLHDL, LDLDIRECT in the last 72 hours. Thyroid Function Tests: No results for input(s): TSH, T4TOTAL, FREET4, T3FREE, THYROIDAB in the last 72 hours. Anemia Panel: No results for input(s): VITAMINB12, FOLATE, FERRITIN, TIBC, IRON, RETICCTPCT in the last 72 hours. Urine analysis: No results found for: COLORURINE, APPEARANCEUR, LABSPEC, PHURINE, GLUCOSEU, HGBUR,  BILIRUBINUR, KETONESUR, PROTEINUR, UROBILINOGEN, NITRITE, LEUKOCYTESUR  Radiological Exams on Admission: CT Head Wo Contrast  Result Date: 02/19/2020 CLINICAL DATA:  Fall while unloading wheelbarrow EXAM: CT HEAD WITHOUT CONTRAST TECHNIQUE: Contiguous axial images were obtained from the base of the skull through the vertex without intravenous contrast. COMPARISON:  None. FINDINGS: Brain: No evidence of acute infarction, hemorrhage, hydrocephalus, extra-axial collection, visible mass lesion or mass effect. Symmetric prominence of the ventricles, cisterns and sulci compatible with parenchymal volume loss. Patchy areas of white matter hypoattenuation are most compatible with chronic microvascular angiopathy. Vascular: Atherosclerotic calcification of the carotid siphons. No hyperdense vessel. Skull: No calvarial fracture or suspicious osseous lesion. No scalp swelling or hematoma. Sinuses/Orbits: Paranasal sinuses and mastoid air cells are predominantly clear. Included orbital structures are unremarkable. Other: None. IMPRESSION: 1. No acute intracranial abnormality. No scalp swelling or calvarial fracture. 2. Mild parenchymal volume loss and chronic microvascular angiopathy changes. Electronically Signed   By: Kreg Shropshire M.D.   On: 02/19/2020 22:26   DG Chest Port 1 View  Result Date: 02/19/2020 CLINICAL DATA:  Hip fracture after a fall EXAM: PORTABLE CHEST 1 VIEW COMPARISON:  None. FINDINGS: Shallow inspiration. Heart size and pulmonary vascularity are normal for technique. Vascular crowding or linear atelectasis in the lung bases. No consolidation or edema. No pleural effusions. No pneumothorax. Mediastinal contours appear intact. IMPRESSION: Shallow inspiration with vascular crowding or linear atelectasis in the lung bases. Electronically Signed   By: Burman Nieves M.D.   On: 02/19/2020 23:02   DG Hip Unilat With Pelvis 2-3 Views Left  Result Date: 02/19/2020 CLINICAL DATA:  Post fall with left  hip pain. EXAM: DG HIP (WITH OR WITHOUT PELVIS) 2-3V LEFT COMPARISON:  None. FINDINGS: Displaced intertrochanteric left femur fracture. Fracture involves the greater trochanter with mild combination. There is proximal migration of the femoral shaft. Femoral head remains seated. Pubic rami are intact. No additional fracture of the pelvis. IMPRESSION: Displaced intertrochanteric left femur fracture. Electronically Signed   By: Narda Rutherford M.D.   On: 02/19/2020 22:28   Assessment/Plan Fall Displaced intertrochanteric left femur fracture     - admit to inpt, med surg     - orthopedics to see, planning procedure per note last night     - PT/OT after procedure     - NPO until eval'd by ortho     - XR w/ Displaced intertrochanteric left femur fracture; CTH clear  HTN     - bystolic resumed  DVT prophylaxis: lovenox  Code Status: FULL  Family Communication: None at bedside  Consults called: Orthopedics called by EDP Admission status: Inpatient d/t pending surgical procedure and currently unsafe for discharge  Status is: Observation  The patient will require care spanning > 2 midnights and should be moved to inpatient because: Unsafe d/c plan  Dispo: The patient is from: Home              Anticipated d/c is to: SNF  Anticipated d/c date is: 3 days              Patient currently is not medically stable to d/c.  Teddy Spike DO [facility]  If 7PM-7AM, please contact night-coverage [email or web address]  02/20/2020, 11:34 AM

## 2020-02-20 NOTE — Discharge Instructions (Signed)
Diet: As you were doing prior to hospitalization   Shower:  May shower but keep the wounds dry, use an occlusive plastic wrap, NO SOAKING IN TUB.  If the bandage gets wet, change with a clean dry gauze.  If you have a splint on, leave the splint in place and keep the splint dry with a plastic bag.  Dressing:  You may change your dressing 3-5 days after surgery, unless you have a splint.  If you have a splint, then just leave the splint in place and we will change your bandages during your first follow-up appointment.    If you had hand or foot surgery, we will plan to remove your stitches in about 2 weeks in the office.  For all other surgeries, there are sticky tapes (steri-strips) on your wounds and all the stitches are absorbable.  Leave the steri-strips in place when changing your dressings, they will peel off with time, usually 2-3 weeks.  Activity:  Increase activity slowly as tolerated, but follow the weight bearing instructions below.  The rules on driving is that you can not be taking narcotics while you drive, and you must feel in control of the vehicle.    Weight Bearing:  Can weight bear as tolerated on left leg  To prevent constipation: you may use a stool softener such as -  Colace (over the counter) 100 mg by mouth twice a day  Drink plenty of fluids (prune juice may be helpful) and high fiber foods Miralax (over the counter) for constipation as needed.    Itching:  If you experience itching with your medications, try taking only a single pain pill, or even half a pain pill at a time.  You may take up to 10 pain pills per day, and you can also use benadryl over the counter for itching or also to help with sleep.   Precautions:  If you experience chest pain or shortness of breath - call 911 immediately for transfer to the hospital emergency department!!  If you develop a fever greater that 101 F, purulent drainage from wound, increased redness or drainage from wound, or calf pain  -- Call the office at [phone]                                                Follow- Up Appointment:  Please call for an appointment to be seen in 2 weeks [place] - [phone]

## 2020-02-20 NOTE — Transfer of Care (Signed)
Immediate Anesthesia Transfer of Care Note  Patient: Spencer [name]  Procedure(s) Performed: INTRAMEDULLARY (IM) NAIL INTERTROCHANTRIC (Left )  Patient Location: PACU  Anesthesia Type:Spinal  Level of Consciousness: drowsy  Airway & Oxygen Therapy: Patient Spontanous Breathing and Patient connected to face mask oxygen  Post-op Assessment: Report given to RN and Post -op Vital signs reviewed and stable  Post vital signs: Reviewed and stable  Last Vitals:  Vitals Value Taken Time  BP 105/57 02/20/20 1601  Temp    Pulse 81 02/20/20 1604  Resp 29 02/20/20 1604  SpO2 100 % 02/20/20 1604  Vitals shown include unvalidated device data.  Last Pain:  Vitals:   02/20/20 1336  TempSrc:   PainSc: 5       Patients Stated Pain Goal: 3 (02/20/20 1336)  Complications: No complications documented.

## 2020-02-21 ENCOUNTER — Encounter (HOSPITAL_COMMUNITY): Payer: Self-pay | Admitting: Orthopedic Surgery

## 2020-02-21 DIAGNOSIS — S72002A Fracture of unspecified part of neck of left femur, initial encounter for closed fracture: Secondary | ICD-10-CM

## 2020-02-21 LAB — BASIC METABOLIC PANEL
Anion gap: 7 (ref 5–15)
BUN: 16 mg/dL (ref 8–23)
CO2: 25 mmol/L (ref 22–32)
Calcium: 8.6 mg/dL — ABNORMAL LOW (ref 8.9–10.3)
Chloride: 105 mmol/L (ref 98–111)
Creatinine, Ser: 0.99 mg/dL (ref 0.61–1.24)
GFR calc Af Amer: >60 mL/min (ref >60)
GFR calc non Af Amer: >60 mL/min (ref >60)
Glucose, Bld: 134 mg/dL — ABNORMAL HIGH (ref 70–99)
Potassium: 4.6 mmol/L (ref 3.5–5.1)
Sodium: 137 mmol/L (ref 135–145)

## 2020-02-21 LAB — CBC
HCT: 35.5 % — ABNORMAL LOW (ref 39.0–52.0)
Hemoglobin: 11.7 g/dL — ABNORMAL LOW (ref 13.0–17.0)
MCH: 31.4 pg (ref 26.0–34.0)
MCHC: 33 g/dL (ref 30.0–36.0)
MCV: 95.2 fL (ref 80.0–100.0)
Platelets: 143 10*3/uL — ABNORMAL LOW (ref 150–400)
RBC: 3.73 MIL/uL — ABNORMAL LOW (ref 4.22–5.81)
RDW: 12.5 % (ref 11.5–15.5)
WBC: 10.5 10*3/uL (ref 4.0–10.5)
nRBC: 0 % (ref 0.0–0.2)

## 2020-02-21 MED ORDER — SENNA 8.6 MG PO TABS
2.0000 | ORAL_TABLET | Freq: Two times a day (BID) | ORAL | Status: DC
  Administered 2020-02-21 – 2020-02-23 (×5): 17.2 mg via ORAL
  Filled 2020-02-21 (×5): qty 2

## 2020-02-21 MED ORDER — ENSURE ENLIVE PO LIQD
237.0000 mL | Freq: Two times a day (BID) | ORAL | Status: DC
  Administered 2020-02-22 – 2020-02-23 (×4): 237 mL via ORAL

## 2020-02-21 MED ORDER — POLYETHYLENE GLYCOL 3350 17 G PO PACK
17.0000 g | PACK | Freq: Every day | ORAL | Status: DC
  Administered 2020-02-21 – 2020-02-22 (×2): 17 g via ORAL
  Filled 2020-02-21 (×2): qty 1

## 2020-02-21 NOTE — Progress Notes (Signed)
Subjective: 1 Day Post-Op s/p Procedure(s): INTRAMEDULLARY (IM) NAIL INTERTROCHANTRIC  Doing well. Pain is moderate this morning. Has not yet had a bowel movement. Denies paraesthesias, chest pain, SOB, Calf pain. No nausea/vomiting. No other complaints.   Objective:  PE: VITALS:   Vitals:   02/20/20 2129 02/21/20 0118 02/21/20 0550 02/21/20 0550  BP: 123/72 131/72 138/80 138/80  Pulse: 81 68 76 79  Resp: 16 16 16 16   Temp: 98.1 F (36.7 C) 98.2 F (36.8 C) 98.7 F (37.1 C) 98.7 F (37.1 C)  TempSrc: Oral Oral Oral Oral  SpO2: 99% 95% 92% 92%  Weight:      Height:       General: Alert, oriented, sitting up in bed, in no acute distress GI: abdomen soft, non-tender MSK: LLE - EHL and FHL intact. Able to move all toes of left foot. Distal sensation intact. Surgical dressings intact with mild drainage. 2+DP pulse. No edema at hip or knee.   LABS  Results for orders placed or performed during the hospital encounter of 02/19/20 (from the past 24 hour(s))  Type and screen [facility]     Status: None   Collection Time: 02/20/20  1:34 PM  Result Value Ref Range   ABO/RH(D) A POS    Antibody Screen NEG    Sample Expiration      02/23/2020,2359 Performed at [facility], [address], [place], [place]   ABO/Rh     Status: None   Collection Time: 02/20/20  1:41 PM  Result Value Ref Range   ABO/RH(D)      A POS Performed at [facility], [address], [place], [place]   CBC     Status: Abnormal   Collection Time: 02/21/20  3:20 AM  Result Value Ref Range   WBC 10.5 4.0 - 10.5 K/uL   RBC 3.73 (L) 4.22 - 5.81 MIL/uL   Hemoglobin 11.7 (L) 13.0 - 17.0 g/dL   HCT 02/23/20 (L) 39 - 52 %   MCV 95.2 80.0 - 100.0 fL   MCH 31.4 26.0 - 34.0 pg   MCHC 33.0 30.0 - 36.0 g/dL   RDW 81.0 17.5 - 10.2 %   Platelets 143 (L) 150 - 400 K/uL   nRBC 0.0 0.0 - 0.2 %  Basic metabolic panel     Status:  Abnormal   Collection Time: 02/21/20  3:20 AM  Result Value Ref Range   Sodium 137 135 - 145 mmol/L   Potassium 4.6 3.5 - 5.1 mmol/L   Chloride 105 98 - 111 mmol/L   CO2 25 22 - 32 mmol/L   Glucose, Bld 134 (H) 70 - 99 mg/dL   BUN 16 8 - 23 mg/dL   Creatinine, Ser 02/23/20 0.61 - 1.24 mg/dL   Calcium 8.6 (L) 8.9 - 10.3 mg/dL   GFR calc non Af Amer >60 >60 mL/min   GFR calc Af Amer >60 >60 mL/min   Anion gap 7 5 - 15    CT Head Wo Contrast  Result Date: 02/19/2020 CLINICAL DATA:  Fall while unloading wheelbarrow EXAM: CT HEAD WITHOUT CONTRAST TECHNIQUE: Contiguous axial images were obtained from the base of the skull through the vertex without intravenous contrast. COMPARISON:  None. FINDINGS: Brain: No evidence of acute infarction, hemorrhage, hydrocephalus, extra-axial collection, visible mass lesion or mass effect. Symmetric prominence of the ventricles, cisterns and sulci compatible with parenchymal volume loss. Patchy areas of white matter  hypoattenuation are most compatible with chronic microvascular angiopathy. Vascular: Atherosclerotic calcification of the carotid siphons. No hyperdense vessel. Skull: No calvarial fracture or suspicious osseous lesion. No scalp swelling or hematoma. Sinuses/Orbits: Paranasal sinuses and mastoid air cells are predominantly clear. Included orbital structures are unremarkable. Other: None. IMPRESSION: 1. No acute intracranial abnormality. No scalp swelling or calvarial fracture. 2. Mild parenchymal volume loss and chronic microvascular angiopathy changes. Electronically Signed   By: Kreg Shropshire M.D.   On: 02/19/2020 22:26   Pelvis Portable  Result Date: 02/20/2020 CLINICAL DATA:  ORIF left intertrochanteric fracture. EXAM: PORTABLE PELVIS 1-2 VIEWS COMPARISON:  Concurrent hip exam reported separately. FINDINGS: Intramedullary nail with trans trochanteric screw partially included in the field of view fixating intertrochanteric femur fracture. Recent  postsurgical change includes air in the adjacent soft tissues. No other fracture of the pelvis or acute findings. IMPRESSION: ORIF left intertrochanteric femur fracture. No immediate postoperative complication. Electronically Signed   By: Narda Rutherford M.D.   On: 02/20/2020 16:49   DG Chest Port 1 View  Result Date: 02/19/2020 CLINICAL DATA:  Hip fracture after a fall EXAM: PORTABLE CHEST 1 VIEW COMPARISON:  None. FINDINGS: Shallow inspiration. Heart size and pulmonary vascularity are normal for technique. Vascular crowding or linear atelectasis in the lung bases. No consolidation or edema. No pleural effusions. No pneumothorax. Mediastinal contours appear intact. IMPRESSION: Shallow inspiration with vascular crowding or linear atelectasis in the lung bases. Electronically Signed   By: Burman Nieves M.D.   On: 02/19/2020 23:02   DG C-Arm 1-60 Min-No Report  Result Date: 02/20/2020 Fluoroscopy was utilized by the requesting physician.  No radiographic interpretation.   DG Hip Port Unilat With Pelvis 1V Left  Result Date: 02/20/2020 CLINICAL DATA:  Postop hip surgery today. EXAM: DG HIP (WITH OR WITHOUT PELVIS) 1V PORT LEFT COMPARISON:  Radiograph yesterday. FINDINGS: Intramedullary rod with trans trochanteric and distal locking screw fixation of intertrochanteric proximal femur fracture. The fracture is in improved alignment compared to preoperative imaging. No periprosthetic lucency. Recent postsurgical change includes air and edema in the soft tissues. IMPRESSION: Intramedullary rod and screw fixation of intertrochanteric proximal femur fracture, in improved alignment compared to preoperative imaging. No immediate postoperative complication Electronically Signed   By: Narda Rutherford M.D.   On: 02/20/2020 16:45   DG HIP OPERATIVE UNILAT W OR W/O PELVIS LEFT  Result Date: 02/20/2020 CLINICAL DATA:  Left intramedullary nail. EXAM: OPERATIVE LEFT HIP (WITH PELVIS IF PERFORMED) 2 VIEWS TECHNIQUE:  Fluoroscopic spot image(s) were submitted for interpretation post-operatively. COMPARISON:  Preoperative radiograph. FINDINGS: Four fluoroscopic spot images obtained in the operating room. Intramedullary nail with trans trochanteric and distal locking screw traverse intertrochanteric femur fracture. Total fluoroscopy time 1 minutes 12 seconds. Total dose 31.545 mGy. IMPRESSION: Fluoroscopic spot views after ORIF left intertrochanteric fracture. Electronically Signed   By: Narda Rutherford M.D.   On: 02/20/2020 16:48   DG Hip Unilat With Pelvis 2-3 Views Left  Result Date: 02/19/2020 CLINICAL DATA:  Post fall with left hip pain. EXAM: DG HIP (WITH OR WITHOUT PELVIS) 2-3V LEFT COMPARISON:  None. FINDINGS: Displaced intertrochanteric left femur fracture. Fracture involves the greater trochanter with mild combination. There is proximal migration of the femoral shaft. Femoral head remains seated. Pubic rami are intact. No additional fracture of the pelvis. IMPRESSION: Displaced intertrochanteric left femur fracture. Electronically Signed   By: Narda Rutherford M.D.   On: 02/19/2020 22:28    Assessment/Plan: Active Problems:   Closed left hip fracture (HCC)  1 Day Post-Op s/p Procedure(s): INTRAMEDULLARY (IM) NAIL INTERTROCHANTRIC - doing well Weightbearing: WBAT LLE, up with PT, does not walk with assistive device at baseline Insicional and dressing care: Reinforce dressings as needed, multiple ABD's applied post operatively due to significant drainage. Will plan to remove and change dressings tomorrow AM.  VTE prophylaxis: Lovenox 40mg  qd x 30 days Pain control: continue current regimen Follow - up plan: 2 weeks with Dr. Dispo: pending PT eval, patient lives at home with sister  Contact information:   Weekdays 8-5 01-26-2006, PA-C [phone] A fter hours and holidays please check [email or web address] for group call information for [facility]  [phone] 02/21/2020, 7:27 AM

## 2020-02-21 NOTE — Progress Notes (Signed)
Initial Nutrition Assessment  DOCUMENTATION CODES:   Obesity unspecified  INTERVENTION:   -Ensure Enlive po BID, each supplement provides 350 kcal and 20 grams of protein  NUTRITION DIAGNOSIS:   Increased nutrient needs related to hip fracture, post-op healing as evidenced by estimated needs.  GOAL:   Patient will meet greater than or equal to 90% of their needs  MONITOR:   PO intake, Supplement acceptance, Labs, Weight trends, I & O's  REASON FOR ASSESSMENT:   Consult Hip fracture protocol  ASSESSMENT:   74 y.o. male with medical history significant of HTN. Presents with hip pain after falls.  9/28: s/p  INTRAMEDULLARY (IM) NAIL INTERTROCHANTRIC  Patient consumed 85% of breakfast this morning. Still awaiting BM.  Will order Ensure supplements given healing needs. Per PT note, recommending CIR at discharge.  No weight history available PTA.  Labs reviewed. Medications: Colace, Ferrous sulfate, Miralax, Senokot  NUTRITION - FOCUSED PHYSICAL EXAM:  No depletions noted.  Diet Order:   Diet Order            Diet regular Room service appropriate? Yes; Fluid consistency: Thin  Diet effective now                 EDUCATION NEEDS:   No education needs have been identified at this time  Skin:  Skin Assessment: Skin Integrity Issues: Skin Integrity Issues:: Incisions Incisions: left hip  Last BM:  9/26  Height:   Ht Readings from Last 1 Encounters:  02/20/20 6\' 3"  (1.905 m)    Weight:   Wt Readings from Last 1 Encounters:  02/20/20 119.7 kg   BMI:  Body mass index is 33 kg/m.  Estimated Nutritional Needs:   Kcal:  2200-2400  Protein:  100-110g  Fluid:  2.2L/day  02/22/20, MS, RD, LDN Inpatient Clinical Dietitian Contact information available via Amion

## 2020-02-21 NOTE — Evaluation (Signed)
Physical Therapy Evaluation Patient Details Name: Spencer [name] MRN: [id number] DOB: [birth year] Today's Date: 02/21/2020   History of Present Illness  Patient is 74 y.o. male s/p Lt IM nail after mechanical fall at home. PMH significant for HTN.  Clinical Impression  Spencer [name] is a 74 y.o. male POD 1 s/p Lt IM nail due to fall and intertrochanteric fracture. Patient reports independence with mobility at baseline. Patient is now limited by functional impairments (see PT problem list below) and requires min assist for bed mobility and Min-Mod assist for transfers and gait with RW. Patient was able to ambulate ~20 feet with RW and min assist and was limited some by hip pain. Patient instructed in exercise to facilitate ROM and circulation to manage edema. Patient will benefit from continued skilled PT interventions to address impairments and progress towards PLOF. Acute PT will follow to progress mobility and stair training in preparation for safe discharge home.     Follow Up Recommendations CIR    Equipment Recommendations  Rolling walker with 5" wheels (TBA)    Recommendations for Other Services OT consult;Rehab consult     Precautions / Restrictions Precautions Precautions: Fall Restrictions Weight Bearing Restrictions: No Other Position/Activity Restrictions: WBAT      Mobility  Bed Mobility Overal bed mobility: Needs Assistance Bed Mobility: Supine to Sit     Supine to sit: HOB elevated;Min assist     General bed mobility comments: min assist and cues to raise trunk with useo f bed rail and assist for LE's off EOB.  Transfers Overall transfer level: Needs assistance Equipment used: Rolling walker (2 wheeled) Transfers: Sit to/from Stand Sit to Stand: Mod assist;+2 safety/equipment;From elevated surface         General transfer comment: cues for safe hand placement/technique with RW, mod assist for power up and min assist to steady once standing.    Ambulation/Gait Ambulation/Gait assistance: Min assist;+2 safety/equipment Gait Distance (Feet): 20 Feet Assistive device: Rolling walker (2 wheeled) Gait Pattern/deviations: Step-to pattern;Decreased stride length;Decreased stance time - left;Decreased step length - left;Decreased weight shift to left;Antalgic;Trunk flexed Gait velocity: decr   General Gait Details: cues for safe step pattern and proximity to RW. cues to improve posture in standing. Min assist to steady intermittently and chair follow for safety.   Stairs            Wheelchair Mobility    Modified Rankin (Stroke Patients Only)       Balance Overall balance assessment: Needs assistance Sitting-balance support: Feet supported Sitting balance-Leahy Scale: Fair     Standing balance support: During functional activity;Bilateral upper extremity supported Standing balance-Leahy Scale: Poor                               Pertinent Vitals/Pain Pain Assessment: 0-10 Pain Score: 5  Pain Location: Lt hip Pain Descriptors / Indicators: Aching;Grimacing;Discomfort Pain Intervention(s): Limited activity within patient's tolerance;Monitored during session;Repositioned;Ice applied;Premedicated before session    Home Living Family/patient expects to be discharged to:: Private residence Living Arrangements: Other relatives (pt's sister) Available Help at Discharge: Family Type of Home: House Home Access: Stairs to enter Entrance Stairs-Rails: Right Entrance Stairs-Number of Steps: 1 Home Layout: One level Home Equipment: Cane - single point;Grab bars - tub/shower;Hand held shower head      Prior Function Level of Independence: Independent         Comments: pt typically ambulates with no device. He still drives, does his  shopping, cleaning, cooking, and is independent for ADL's.     Hand Dominance   Dominant Hand: Right    Extremity/Trunk Assessment   Upper Extremity Assessment Upper  Extremity Assessment: Generalized weakness;Defer to OT evaluation    Lower Extremity Assessment Lower Extremity Assessment: Generalized weakness;LLE deficits/detail LLE Deficits / Details: knee ROM limited and pt unable to fully extend (lacking 5-10 degrees) LLE Sensation: WNL LLE Coordination: WNL    Cervical / Trunk Assessment Cervical / Trunk Assessment: Kyphotic  Communication   Communication: No difficulties  Cognition Arousal/Alertness: Awake/alert Behavior During Therapy: WFL for tasks assessed/performed Overall Cognitive Status: Within Functional Limits for tasks assessed                                        General Comments      Exercises General Exercises - Lower Extremity Ankle Circles/Pumps: AROM;Both;20 reps;Seated Quad Sets: AROM;Both;5 reps (pt with poor quad activation, pt using glutes/hamstrings)   Assessment/Plan    PT Assessment Patient needs continued PT services  PT Problem List Decreased strength;Decreased range of motion;Decreased activity tolerance;Decreased balance;Decreased mobility;Decreased knowledge of use of DME;Decreased safety awareness;Decreased knowledge of precautions;Pain;Obesity       PT Treatment Interventions DME instruction;Gait training;Stair training;Functional mobility training;Therapeutic activities;Therapeutic exercise;Balance training;Patient/family education    PT Goals (Current goals can be found in the Care Plan section)  Acute Rehab PT Goals Patient Stated Goal: get back to being independent PT Goal Formulation: With patient Time For Goal Achievement: 02/28/20 Potential to Achieve Goals: Good    Frequency Min 4X/week   Barriers to discharge        Co-evaluation               AM-PAC PT "6 Clicks" Mobility  Outcome Measure Help needed turning from your back to your side while in a flat bed without using bedrails?: A Little Help needed moving from lying on your back to sitting on the side of  a flat bed without using bedrails?: A Little Help needed moving to and from a bed to a chair (including a wheelchair)?: A Lot Help needed standing up from a chair using your arms (e.g., wheelchair or bedside chair)?: A Lot Help needed to walk in hospital room?: A Little Help needed climbing 3-5 steps with a railing? : A Lot 6 Click Score: 15    End of Session Equipment Utilized During Treatment: Gait belt Activity Tolerance: Patient tolerated treatment well Patient left: in chair;with call bell/phone within reach;with chair alarm set Nurse Communication: Mobility status PT Visit Diagnosis: Muscle weakness (generalized) (M62.81);Difficulty in walking, not elsewhere classified (R26.2);History of falling (Z91.81);Unsteadiness on feet (R26.81)    Time: 3903-0092 PT Time Calculation (min) (ACUTE ONLY): 22 min   Charges:   PT Evaluation $PT Eval Low Complexity: 1 Low          Wynn Maudlin, DPT Acute Rehabilitation Services  Office [phone] Pager [phone]  02/21/2020 12:17 PM

## 2020-02-21 NOTE — Progress Notes (Signed)
PROGRESS NOTE  Spencer [name] JJH:[id number] DOB: 15-Jul-1945 DOA: 02/19/2020 PCP: [facility]  HPI/Recap of past 24 hours: HPI: Spencer [name] is a 74 y.o. male with medical history significant of HTN. Presents with hip pain after falls. He states he was loading his wheelbarrow yesterday. As he threw a bag into the wheelbarrow, it overturned and pulled him with it. In the process he landed on his left side and immediately had pain. He denies LOC, but did bump his head. He remembers the entire fall. He reported inability to walk since the fall. He denies any additional alleviating or aggravating factors. He called for transport to [facility] ED.   ED Course: He was found to have an intertrochanteric fracture. Ortho was consulted and rec'd transfer to [facility]. [facility] was called for admission.   POD#1 post left hip surgical repair Post-Op s/p Procedure(s): INTRAMEDULLARY (IM) NAIL INTERTROCHANTRIC on 02/20/20.  02/21/20: Reports constipation for a few days.  Added additional laxatives and stool softeners.  Left hip pain is well controlled.  Assessment/Plan: Active Problems:   Closed left hip fracture (HCC)  Closed left hip fracture s/p left hip intramedullary nail fixation by Dr. Dion Saucier on 02/20/20 PT recs CIR Pain control Recommendation per ortho: Weightbearing: WBAT LLE, up with PT, does not walk with assistive device at baseline Insicional and dressing care: Reinforce dressings as needed, multiple ABD's applied post operatively due to significant drainage. Will plan to remove and change dressings tomorrow AM.  VTE prophylaxis: Lovenox 40mg  qd x 30 days Pain control: continue current regimen Follow - up plan: 2 weeks with Dr. Dispo: pending PT eval, patient lives at home with sister  Essential HTN BP is stable Continue home bystolic Monitor vital signs  DVT prophylaxis: lovenox SQ daily Code Status: FULL  Family Communication: None at bedside  Consults called: Orthopedics  called by EDP  Status is: Inpatient    Dispo: The patient is from:Home              Anticipated d/c is to:CIR              Anticipated d/c date is: 02/22/20              Patient currently is not stable for dc, ongoing management of left hip fracture post surgical repair.        Objective: Vitals:   02/21/20 0118 02/21/20 0550 02/21/20 0550 02/21/20 1348  BP: 131/72 138/80 138/80 121/70  Pulse: 68 76 79 78  Resp: 16 16 16    Temp: 98.2 F (36.8 C) 98.7 F (37.1 C) 98.7 F (37.1 C) 98.1 F (36.7 C)  TempSrc: Oral Oral Oral   SpO2: 95% 92% 92% 97%  Weight:      Height:        Intake/Output Summary (Last 24 hours) at 02/21/2020 1713 Last data filed at 02/21/2020 1500 Gross per 24 hour  Intake 2065 ml  Output 1300 ml  Net 765 ml   Filed Weights   02/19/20 2004 02/20/20 1336  Weight: 119.7 kg 119.7 kg    Exam:  . General: 74 y.o. year-old male well developed well nourished in no acute distress.  Alert and oriented x3. . Cardiovascular: Regular rate and rhythm with no rubs or gallops.  No thyromegaly or JVD noted.   02/22/20 Respiratory: Clear to auscultation with no wheezes or rales. Good inspiratory effort. . Abdomen: Soft nontender nondistended with normal bowel sounds x4 quadrants. . Psychiatry: Mood is appropriate for condition and setting  Data Reviewed: CBC: Recent Labs  Lab 02/19/20 2259 02/21/20 0320  WBC 10.1 10.5  NEUTROABS 8.7*  --   HGB 14.2 11.7*  HCT 43.3 35.5*  MCV 94.5 95.2  PLT 158 143*   Basic Metabolic Panel: Recent Labs  Lab 02/19/20 2259 02/21/20 0320  NA 141 137  K 4.3 4.6  CL 106 105  CO2 27 25  GLUCOSE 133* 134*  BUN 17 16  CREATININE 1.06 0.99  CALCIUM 9.0 8.6*   GFR: Estimated Creatinine Clearance: 94.1 mL/min (by C-G formula based on SCr of 0.99 mg/dL). Liver Function Tests: No results for input(s): AST, ALT, ALKPHOS, BILITOT, PROT, ALBUMIN in the last 168 hours. No results for input(s): LIPASE, AMYLASE in the last 168  hours. No results for input(s): AMMONIA in the last 168 hours. Coagulation Profile: No results for input(s): INR, PROTIME in the last 168 hours. Cardiac Enzymes: No results for input(s): CKTOTAL, CKMB, CKMBINDEX, TROPONINI in the last 168 hours. BNP (last 3 results) No results for input(s): PROBNP in the last 8760 hours. HbA1C: No results for input(s): HGBA1C in the last 72 hours. CBG: No results for input(s): GLUCAP in the last 168 hours. Lipid Profile: No results for input(s): CHOL, HDL, LDLCALC, TRIG, CHOLHDL, LDLDIRECT in the last 72 hours. Thyroid Function Tests: No results for input(s): TSH, T4TOTAL, FREET4, T3FREE, THYROIDAB in the last 72 hours. Anemia Panel: No results for input(s): VITAMINB12, FOLATE, FERRITIN, TIBC, IRON, RETICCTPCT in the last 72 hours. Urine analysis: No results found for: COLORURINE, APPEARANCEUR, LABSPEC, PHURINE, GLUCOSEU, HGBUR, BILIRUBINUR, KETONESUR, PROTEINUR, UROBILINOGEN, NITRITE, LEUKOCYTESUR Sepsis Labs: @LABRCNTIP (procalcitonin:4,lacticidven:4)  ) Recent Results (from the past 240 hour(s))  Respiratory Panel by RT PCR (Flu A&B, Covid) - Nasopharyngeal Swab     Status: None   Collection Time: 02/19/20 10:59 PM   Specimen: Nasopharyngeal Swab  Result Value Ref Range Status   SARS Coronavirus 2 by RT PCR NEGATIVE NEGATIVE Final    Comment: (NOTE) SARS-CoV-2 target nucleic acids are NOT DETECTED.  The SARS-CoV-2 RNA is generally detectable in upper respiratoy specimens during the acute phase of infection. The lowest concentration of SARS-CoV-2 viral copies this assay can detect is 131 copies/mL. A negative result does not preclude SARS-Cov-2 infection and should not be used as the sole basis for treatment or other patient management decisions. A negative result may occur with  improper specimen collection/handling, submission of specimen other than nasopharyngeal swab, presence of viral mutation(s) within the areas targeted by this assay,  and inadequate number of viral copies (<131 copies/mL). A negative result must be combined with clinical observations, patient history, and epidemiological information. The expected result is Negative.  Fact Sheet for Patients:  02/21/20  Fact Sheet for Healthcare Providers:  https://www.moore.com/  This test is no t yet approved or cleared by the https://www.young.biz/ FDA and  has been authorized for detection and/or diagnosis of SARS-CoV-2 by FDA under an Emergency Use Authorization (EUA). This EUA will remain  in effect (meaning this test can be used) for the duration of the COVID-19 declaration under Section 564(b)(1) of the Act, 21 U.S.C. section 360bbb-3(b)(1), unless the authorization is terminated or revoked sooner.     Influenza A by PCR NEGATIVE NEGATIVE Final   Influenza B by PCR NEGATIVE NEGATIVE Final    Comment: (NOTE) The Xpert Xpress SARS-CoV-2/FLU/RSV assay is intended as an aid in  the diagnosis of influenza from Nasopharyngeal swab specimens and  should not be used as a sole basis for treatment. Nasal washings  and  aspirates are unacceptable for Xpert Xpress SARS-CoV-2/FLU/RSV  testing.  Fact Sheet for Patients: https://www.moore.com/  Fact Sheet for Healthcare Providers: https://www.young.biz/  This test is not yet approved or cleared by the [place] FDA and  has been authorized for detection and/or diagnosis of SARS-CoV-2 by  FDA under an Emergency Use Authorization (EUA). This EUA will remain  in effect (meaning this test can be used) for the duration of the  Covid-19 declaration under Section 564(b)(1) of the Act, 21  U.S.C. section 360bbb-3(b)(1), unless the authorization is  terminated or revoked. Performed at [facility], [address] [place], [place] [address]       Studies: No results found.  Scheduled Meds: . docusate sodium  100 mg  Oral BID  . enoxaparin (LOVENOX) injection  40 mg Subcutaneous Q24H  . feeding supplement (ENSURE ENLIVE)  237 mL Oral BID BM  . ferrous sulfate  325 mg Oral TID PC  . nebivolol  10 mg Oral Daily  . polyethylene glycol  17 g Oral Daily  . senna  2 tablet Oral BID    Continuous Infusions: . 0.45 % NaCl with KCl 20 mEq / L Stopped (02/21/20 0831)  . methocarbamol (ROBAXIN) IV       LOS: 1 day     Darlin Drop, MD [facility] Pager [phone]  If 7PM-7AM, please contact night-coverage [email or web address] Password [facility] 02/21/2020, 5:13 PM

## 2020-02-21 NOTE — Progress Notes (Signed)
Inpatient Rehab Admissions Coordinator Note:   Per therapy recommendations, pt was screened for CIR candidacy by Estill Dooms, PT, DPT.  At this time we are recommending a CIR consult and I will place an order per our protocol.  Please contact me with questions.   Estill Dooms, PT, DPT [phone] 02/21/20 1:10 PM

## 2020-02-21 NOTE — Progress Notes (Signed)
Inpatient Rehab Admissions:  Inpatient Rehab Consult received. I spoke with patient to discuss goals and expectations of an inpatient rehab admission.  Pt is open to CIR at [facility].  States he lives with his sister and he was still working prior to admission as a Surveyor, mining.  Expect that pt would reach mod I level with brief CIR stay.  [employer] coverage with pt, and he states BCBS is primary and Medicare Part A is secondary (confirmed on passport onesource).  I will need insurance authorization so will start that today.   Signed: Estill Dooms, PT, DPT Admissions Coordinator [phone] 02/21/20  3:31 PM

## 2020-02-21 NOTE — Progress Notes (Addendum)
Physical Therapy recommends CIR. TOC staff will continue to follow this patient.

## 2020-02-22 LAB — BASIC METABOLIC PANEL
Anion gap: 7 (ref 5–15)
BUN: 18 mg/dL (ref 8–23)
CO2: 26 mmol/L (ref 22–32)
Calcium: 8.4 mg/dL — ABNORMAL LOW (ref 8.9–10.3)
Chloride: 102 mmol/L (ref 98–111)
Creatinine, Ser: 0.92 mg/dL (ref 0.61–1.24)
GFR calc Af Amer: >60 mL/min (ref >60)
GFR calc non Af Amer: >60 mL/min (ref >60)
Glucose, Bld: 106 mg/dL — ABNORMAL HIGH (ref 70–99)
Potassium: 4 mmol/L (ref 3.5–5.1)
Sodium: 135 mmol/L (ref 135–145)

## 2020-02-22 LAB — CBC
HCT: 33 % — ABNORMAL LOW (ref 39.0–52.0)
Hemoglobin: 10.8 g/dL — ABNORMAL LOW (ref 13.0–17.0)
MCH: 31.3 pg (ref 26.0–34.0)
MCHC: 32.7 g/dL (ref 30.0–36.0)
MCV: 95.7 fL (ref 80.0–100.0)
Platelets: 131 10*3/uL — ABNORMAL LOW (ref 150–400)
RBC: 3.45 MIL/uL — ABNORMAL LOW (ref 4.22–5.81)
RDW: 12.6 % (ref 11.5–15.5)
WBC: 10.7 10*3/uL — ABNORMAL HIGH (ref 4.0–10.5)
nRBC: 0 % (ref 0.0–0.2)

## 2020-02-22 MED ORDER — POLYETHYLENE GLYCOL 3350 17 G PO PACK: 17.0000 g | PACK | Freq: Every day | ORAL | 0 refills | Status: DC

## 2020-02-22 MED ORDER — BISACODYL 10 MG RE SUPP
10.0000 mg | Freq: Once | RECTAL | Status: AC
  Administered 2020-02-22: 10 mg via RECTAL
  Filled 2020-02-22: qty 1

## 2020-02-22 MED ORDER — ENSURE ENLIVE PO LIQD: 237.0000 mL | Freq: Two times a day (BID) | ORAL | 0 refills | Status: AC

## 2020-02-22 MED ORDER — FERROUS SULFATE 325 (65 FE) MG PO TABS
325.0000 mg | ORAL_TABLET | Freq: Two times a day (BID) | ORAL | 0 refills | Status: DC
Start: 2020-02-22 — End: 2020-03-22

## 2020-02-22 MED ORDER — HYDROCODONE-ACETAMINOPHEN 5-325 MG PO TABS: 1.0000 | ORAL_TABLET | Freq: Three times a day (TID) | ORAL | 0 refills | Status: AC | PRN

## 2020-02-22 MED ORDER — POLYETHYLENE GLYCOL 3350 17 G PO PACK
17.0000 g | PACK | Freq: Two times a day (BID) | ORAL | Status: DC
  Administered 2020-02-22 – 2020-02-23 (×2): 17 g via ORAL
  Filled 2020-02-22 (×2): qty 1

## 2020-02-22 MED ORDER — METHOCARBAMOL 500 MG PO TABS: 500.0000 mg | ORAL_TABLET | Freq: Four times a day (QID) | ORAL | 0 refills | Status: DC | PRN

## 2020-02-22 NOTE — Discharge Summary (Signed)
Discharge Summary  Zebedee IbaCurtis Quinlivan ZOX:[id number] DOB: July 16, 1945  PCP: [facility]  Admit date: 02/19/2020 Discharge date: 02/22/2020  Time spent: 35 minutes   Recommendations for Outpatient Follow-up:  1. Follow-up with orthopedic surgery 2. Follow-up with your primary care provider 3. Take medications as prescribed. 4. Continue PT OT with assistance and fall precautions at inpatient rehab.  Discharge Diagnoses:  Active Hospital Problems   Diagnosis Date Noted  . Closed left hip fracture (HCC) 02/19/2020    Resolved Hospital Problems  No resolved problems to display.    Discharge Condition: Stable  Diet recommendation: Resume previous diet.  Vitals:   02/22/20 0527 02/22/20 1114  BP: 128/76 126/87  Pulse: 79 84  Resp: 17 14  Temp: 99.1 F (37.3 C) 99.1 F (37.3 C)  SpO2: (!) 89% 94%    History of present illness:  WJX:BJYNWGHPI:Burlin Millsis a 74 y.o.malewith medical history significant ofHTN. Presents with hip pain after falls. He states he was loading his wheelbarrow yesterday. As he threw a bag into the wheelbarrow, it overturned and pulled him with it. In the process he landed on his left side and immediately had pain. He denies LOC, but did bump his head. He remembers the entire fall. He reported inability to walk since the fall. He denies any additional alleviating or aggravating factors. [facility]  ED Course:He was found to have an intertrochanteric fracture. Ortho was consulted and [facility]'d [facility]. [facility] was called for admission.  POD#2 post left hip surgical repair Post-Ops/p Procedure(s): INTRAMEDULLARY (IM) NAIL INTERTROCHANTRIC on 02/20/20.  02/22/20: Reports constipation for a few days.  Added additional laxatives and stool softeners.  Left hip pain is well controlled.  PT assessment recommended CIR.  Hospital Course:  Active Problems:   Closed left hip fracture (HCC)  Closed left hip fracture s/p left hip  intramedullary nail fixation by Dr. Dion SaucierLandau on 02/20/20 PT recs CIR Pain control Recommendation per ortho: Weightbearing:WBATLLE, up with PT, does not walk with assistive device at baseline Insicional and dressing care:Reinforce dressings as needed, multiple ABD's applied post operatively due to significant drainage. Will plan to remove and change dressings tomorrow AM. VTE prophylaxis:Lovenox 40mg  qdx 30 days Pain control:continue current regimen Follow - up plan:2 weekswith Dr. Dion SaucierLandau Dispo: pending PT eval, patient lives at home with sister  Essential HTN BP is stable Continue home bystolic Monitor vital signs  DVT prophylaxis:lovenoxSQ daily Code Status:FULL Family Communication:None at bedside Consults called:Orthopedic surgery.    Procedures: s/p left hip intramedullary nail fixation by Dr. Dion SaucierLandau on 02/20/20   Discharge Exam: BP 126/87 (BP Location: Left Arm)   Pulse 84   Temp 99.1 F (37.3 C) (Oral)   Resp 14   Ht 6\' 3"  (1.905 m)   Wt 119.7 kg   SpO2 94%   BMI 33.00 kg/m  . General: 74 y.o. year-old male well developed well nourished in no acute distress.  Alert and oriented x3. . Cardiovascular: Regular rate and rhythm with no rubs or gallops.  No thyromegaly or JVD noted.   Marland Kitchen. Respiratory: Clear to auscultation with no wheezes or rales. Good inspiratory effort. . Abdomen: Soft nontender nondistended with normal bowel sounds x4 quadrants. . Musculoskeletal: No lower extremity edema. 2/4 pulses in all 4 extremities. Marland Kitchen. Psychiatry: Mood is appropriate for condition and setting  Discharge Instructions You were cared for by a hospitalist during your hospital stay. If you have any questions about your discharge medications or the care you  received while you were in the hospital after you are discharged, you can call the unit and asked to speak with the hospitalist on call if the hospitalist that took care of you is not available. Once you are discharged,  your primary care physician will handle any further medical issues. Please note that NO REFILLS for any discharge medications will be authorized once you are discharged, as it is imperative that you return to your primary care physician (or establish a relationship with a primary care physician if you do not have one) for your aftercare needs so that they can reassess your need for medications and monitor your lab values.   Allergies as of 02/22/2020   No Known Allergies     Medication List    TAKE these medications   enoxaparin 40 MG/0.4ML injection Commonly known as: Lovenox Inject 0.4 mLs (40 mg total) into the skin daily.   feeding supplement (ENSURE ENLIVE) Liqd Take 237 mLs by mouth 2 (two) times daily between meals for 7 days.   ferrous sulfate 325 (65 FE) MG tablet Take 1 tablet (325 mg total) by mouth 2 (two) times daily with a meal.   HYDROcodone-acetaminophen 5-325 MG tablet Commonly known as: NORCO/VICODIN Take 1-2 tablets by mouth 3 (three) times daily as needed for up to 3 days for severe pain (pain score 4-6).   methocarbamol 500 MG tablet Commonly known as: ROBAXIN Take 1 tablet (500 mg total) by mouth every 6 (six) hours as needed for muscle spasms.   nebivolol 10 MG tablet Commonly known as: BYSTOLIC Take 10 mg by mouth daily.   polyethylene glycol 17 g packet Commonly known as: MIRALAX / GLYCOLAX Take 17 g by mouth daily. Start taking on: February 23, 2020   rosuvastatin 40 MG tablet Commonly known as: CRESTOR Take 40 mg by mouth at bedtime.   Vitamin D (Ergocalciferol) 1.25 MG (50000 UNIT) Caps capsule Commonly known as: DRISDOL Take 50,000 Units by mouth once a week.      No Known Allergies  Follow-up Information    Teryl Lucy, MD. Schedule an appointment as soon as possible for a visit in 2 weeks.   Specialty: Orthopedic Surgery Contact information: [address] [place] [address] [phone]        [facility]. Call in 1 day(s).   Why: please call for a post hospital follow up appointment  Contact information: [address] [place] [facility] [place] [address] [phone]                The results of significant diagnostics from this hospitalization (including imaging, microbiology, ancillary and laboratory) are listed below for reference.    Significant Diagnostic Studies: CT Head Wo Contrast  Result Date: 02/19/2020 CLINICAL DATA:  Fall while unloading wheelbarrow EXAM: CT HEAD WITHOUT CONTRAST TECHNIQUE: Contiguous axial images were obtained from the base of the skull through the vertex without intravenous contrast. COMPARISON:  None. FINDINGS: Brain: No evidence of acute infarction, hemorrhage, hydrocephalus, extra-axial collection, visible mass lesion or mass effect. Symmetric prominence of the ventricles, cisterns and sulci compatible with parenchymal volume loss. Patchy areas of white matter hypoattenuation are most compatible with chronic microvascular angiopathy. Vascular: Atherosclerotic calcification of the carotid siphons. No hyperdense vessel. Skull: No calvarial fracture or suspicious osseous lesion. No scalp swelling or hematoma. Sinuses/Orbits: Paranasal sinuses and mastoid air cells are predominantly clear. Included orbital structures are unremarkable. Other: None. IMPRESSION: 1. No acute intracranial abnormality. No scalp swelling or calvarial  fracture. 2. Mild parenchymal volume loss and chronic microvascular angiopathy changes. Electronically Signed   By: Kreg Shropshire M.D.   On: 02/19/2020 22:26   Pelvis Portable  Result Date: 02/20/2020 CLINICAL DATA:  ORIF left intertrochanteric fracture. EXAM: PORTABLE PELVIS 1-2 VIEWS COMPARISON:  Concurrent hip exam reported separately. FINDINGS: Intramedullary nail with trans trochanteric screw partially included in the field of view fixating intertrochanteric femur fracture. Recent postsurgical change includes air in the adjacent  soft tissues. No other fracture of the pelvis or acute findings. IMPRESSION: ORIF left intertrochanteric femur fracture. No immediate postoperative complication. Electronically Signed   By: Narda Rutherford M.D.   On: 02/20/2020 16:49   DG Chest Port 1 View  Result Date: 02/19/2020 CLINICAL DATA:  Hip fracture after a fall EXAM: PORTABLE CHEST 1 VIEW COMPARISON:  None. FINDINGS: Shallow inspiration. Heart size and pulmonary vascularity are normal for technique. Vascular crowding or linear atelectasis in the lung bases. No consolidation or edema. No pleural effusions. No pneumothorax. Mediastinal contours appear intact. IMPRESSION: Shallow inspiration with vascular crowding or linear atelectasis in the lung bases. Electronically Signed   By: Burman Nieves M.D.   On: 02/19/2020 23:02   DG C-Arm 1-60 Min-No Report  Result Date: 02/20/2020 CLINICAL DATA:  Left intramedullary nail. EXAM: OPERATIVE LEFT HIP (WITH PELVIS IF PERFORMED) 2 VIEWS TECHNIQUE: Fluoroscopic spot image(s) were submitted for interpretation post-operatively. COMPARISON:  Preoperative radiograph. FINDINGS: Four fluoroscopic spot images obtained in the operating room. Intramedullary nail with trans trochanteric and distal locking screw traverse intertrochanteric femur fracture. Total fluoroscopy time 1 minutes 12 seconds. Total dose 31.545 mGy. IMPRESSION: Fluoroscopic spot views after ORIF left intertrochanteric fracture. Electronically Signed   By: Narda Rutherford M.D.   On: 02/20/2020 16:48   DG Hip Port Unilat With Pelvis 1V Left  Result Date: 02/20/2020 CLINICAL DATA:  Postop hip surgery today. EXAM: DG HIP (WITH OR WITHOUT PELVIS) 1V PORT LEFT COMPARISON:  Radiograph yesterday. FINDINGS: Intramedullary rod with trans trochanteric and distal locking screw fixation of intertrochanteric proximal femur fracture. The fracture is in improved alignment compared to preoperative imaging. No periprosthetic lucency. Recent postsurgical  change includes air and edema in the soft tissues. IMPRESSION: Intramedullary rod and screw fixation of intertrochanteric proximal femur fracture, in improved alignment compared to preoperative imaging. No immediate postoperative complication Electronically Signed   By: Narda Rutherford M.D.   On: 02/20/2020 16:45   DG HIP OPERATIVE UNILAT W OR W/O PELVIS LEFT  Result Date: 02/20/2020 CLINICAL DATA:  Left intramedullary nail. EXAM: OPERATIVE LEFT HIP (WITH PELVIS IF PERFORMED) 2 VIEWS TECHNIQUE: Fluoroscopic spot image(s) were submitted for interpretation post-operatively. COMPARISON:  Preoperative radiograph. FINDINGS: Four fluoroscopic spot images obtained in the operating room. Intramedullary nail with trans trochanteric and distal locking screw traverse intertrochanteric femur fracture. Total fluoroscopy time 1 minutes 12 seconds. Total dose 31.545 mGy. IMPRESSION: Fluoroscopic spot views after ORIF left intertrochanteric fracture. Electronically Signed   By: Narda Rutherford M.D.   On: 02/20/2020 16:48   DG Hip Unilat With Pelvis 2-3 Views Left  Result Date: 02/19/2020 CLINICAL DATA:  Post fall with left hip pain. EXAM: DG HIP (WITH OR WITHOUT PELVIS) 2-3V LEFT COMPARISON:  None. FINDINGS: Displaced intertrochanteric left femur fracture. Fracture involves the greater trochanter with mild combination. There is proximal migration of the femoral shaft. Femoral head remains seated. Pubic rami are intact. No additional fracture of the pelvis. IMPRESSION: Displaced intertrochanteric left femur fracture. Electronically Signed   By: Ivette Loyal.D.  On: 02/19/2020 22:28    Microbiology: Recent Results (from the past 240 hour(s))  Respiratory Panel by RT PCR (Flu A&B, Covid) - Nasopharyngeal Swab     Status: None   Collection Time: 02/19/20 10:59 PM   Specimen: Nasopharyngeal Swab  Result Value Ref Range Status   SARS Coronavirus 2 by RT PCR NEGATIVE NEGATIVE Final    Comment: (NOTE) SARS-CoV-2  target nucleic acids are NOT DETECTED.  The SARS-CoV-2 RNA is generally detectable in upper respiratoy specimens during the acute phase of infection. The lowest concentration of SARS-CoV-2 viral copies this assay can detect is 131 copies/mL. A negative result does not preclude SARS-Cov-2 infection and should not be used as the sole basis for treatment or other patient management decisions. A negative result may occur with  improper specimen collection/handling, submission of specimen other than nasopharyngeal swab, presence of viral mutation(s) within the areas targeted by this assay, and inadequate number of viral copies (<131 copies/mL). A negative result must be combined with clinical observations, patient history, and epidemiological information. The expected result is Negative.  Fact Sheet for Patients:  https://www.moore.com/  Fact Sheet for Healthcare Providers:  https://www.young.biz/  This test is no t yet approved or cleared by the [place] FDA and  has been authorized for detection and/or diagnosis of SARS-CoV-2 by FDA under an Emergency Use Authorization (EUA). This EUA will remain  in effect (meaning this test can be used) for the duration of the COVID-19 declaration under Section 564(b)(1) of the Act, 21 U.S.C. section 360bbb-3(b)(1), unless the authorization is terminated or revoked sooner.     Influenza A by PCR NEGATIVE NEGATIVE Final   Influenza B by PCR NEGATIVE NEGATIVE Final    Comment: (NOTE) The Xpert Xpress SARS-CoV-2/FLU/RSV assay is intended as an aid in  the diagnosis of influenza from Nasopharyngeal swab specimens and  should not be used as a sole basis for treatment. Nasal washings and  aspirates are unacceptable for Xpert Xpress SARS-CoV-2/FLU/RSV  testing.  Fact Sheet for Patients: https://www.moore.com/  Fact Sheet for Healthcare  Providers: https://www.young.biz/  This test is not yet approved or cleared by the [place] FDA and  has been authorized for detection and/or diagnosis of SARS-CoV-2 by  FDA under an Emergency Use Authorization (EUA). This EUA will remain  in effect (meaning this test can be used) for the duration of the  Covid-19 declaration under Section 564(b)(1) of the Act, 21  U.S.C. section 360bbb-3(b)(1), unless the authorization is  terminated or revoked. Performed at [facility], [address] [place], [place] [address]      Labs: Basic Metabolic Panel: Recent Labs  Lab 02/19/20 2259 02/21/20 0320 02/22/20 0343  NA 141 137 135  K 4.3 4.6 4.0  CL 106 105 102  CO2 27 25 26   GLUCOSE 133* 134* 106*  BUN 17 16 18   CREATININE 1.06 0.99 0.92  CALCIUM 9.0 8.6* 8.4*   Liver Function Tests: No results for input(s): AST, ALT, ALKPHOS, BILITOT, PROT, ALBUMIN in the last 168 hours. No results for input(s): LIPASE, AMYLASE in the last 168 hours. No results for input(s): AMMONIA in the last 168 hours. CBC: Recent Labs  Lab 02/19/20 2259 02/21/20 0320 02/22/20 0343  WBC 10.1 10.5 10.7*  NEUTROABS 8.7*  --   --   HGB 14.2 11.7* 10.8*  HCT 43.3 35.5* 33.0*  MCV 94.5 95.2 95.7  PLT 158 143* 131*   Cardiac Enzymes: No results for input(s): CKTOTAL, CKMB, CKMBINDEX, TROPONINI  in the last 168 hours. BNP: BNP (last 3 results) No results for input(s): BNP in the last 8760 hours.  ProBNP (last 3 results) No results for input(s): PROBNP in the last 8760 hours.  CBG: No results for input(s): GLUCAP in the last 168 hours.     Signed:  Darlin Drop, MD [facility] 02/22/2020, 12:16 PM

## 2020-02-22 NOTE — Progress Notes (Addendum)
Physical Therapy Treatment Patient Details Name: Spencer [name] MRN: [id number] DOB: 11/07/1945  Today's Date: 02/22/2020    History of Present Illness Patient is 74 y.o. male s/p Lt IM nail after mechanical fall at home. PMH significant for HTN.    PT Comments    Pt very cooperative but limited by increased fatigue and pain and continues to require increased time and significant assist for performance of all basic mobility tasks. Will return for therex - deferred at pt request on arrival of bfast.  Follow Up Recommendations  CIR     Equipment Recommendations  Rolling walker with 5" wheels    Recommendations for Other Services OT consult;Rehab consult     Precautions / Restrictions Precautions Precautions: Fall Restrictions Weight Bearing Restrictions: No Other Position/Activity Restrictions: WBAT    Mobility  Bed Mobility Overal bed mobility: Needs Assistance Bed Mobility: Supine to Sit     Supine to sit: HOB elevated;Mod assist     General bed mobility comments: mod assist and cues to raise trunk with useo f bed rail and assist for LE's off EOB.  Transfers Overall transfer level: Needs assistance Equipment used: Rolling walker (2 wheeled) Transfers: Sit to/from Stand Sit to Stand: Mod assist;+2 safety/equipment;From elevated surface         General transfer comment: cues for safe hand placement/technique with RW, mod assist for power up and min assist to steady once standing.   Ambulation/Gait Ambulation/Gait assistance: Min assist;+2 safety/equipment Gait Distance (Feet): 8 Feet Assistive device: Rolling walker (2 wheeled) Gait Pattern/deviations: Step-to pattern;Decreased stride length;Decreased stance time - left;Decreased step length - left;Decreased weight shift to left;Antalgic;Trunk flexed Gait velocity: decr   General Gait Details: cues for safe step pattern and proximity to RW. cues to improve posture in standing. Min assist to steady intermittently  and chair follow for safety.    Stairs             Wheelchair Mobility    Modified Rankin (Stroke Patients Only)       Balance Overall balance assessment: Needs assistance Sitting-balance support: Feet supported Sitting balance-Leahy Scale: Fair     Standing balance support: During functional activity;Bilateral upper extremity supported Standing balance-Leahy Scale: Poor                              Cognition Arousal/Alertness: Awake/alert Behavior During Therapy: WFL for tasks assessed/performed Overall Cognitive Status: Within Functional Limits for tasks assessed                                        Exercises      General Comments        Pertinent Vitals/Pain Pain Assessment: 0-10 Pain Score: 5  Pain Location: Lt hip Pain Descriptors / Indicators: Aching;Grimacing;Discomfort Pain Intervention(s): Limited activity within patient's tolerance;Monitored during session;Premedicated before session;Ice applied    Home Living                      Prior Function            PT Goals (current goals can now be found in the care plan section) Acute Rehab PT Goals Patient Stated Goal: get back to being independent PT Goal Formulation: With patient Time For Goal Achievement: 02/28/20 Potential to Achieve Goals: Good Progress towards PT goals: Not progressing toward goals - comment (Increased  fatigue this am)    Frequency    Min 4X/week      PT Plan Current plan remains appropriate    Co-evaluation              AM-PAC PT "6 Clicks" Mobility   Outcome Measure  Help needed turning from your back to your side while in a flat bed without using bedrails?: A Little Help needed moving from lying on your back to sitting on the side of a flat bed without using bedrails?: A Lot Help needed moving to and from a bed to a chair (including a wheelchair)?: A Lot Help needed standing up from a chair using your arms (e.g.,  wheelchair or bedside chair)?: A Lot Help needed to walk in hospital room?: A Lot Help needed climbing 3-5 steps with a railing? : A Lot 6 Click Score: 13    End of Session Equipment Utilized During Treatment: Gait belt Activity Tolerance: Patient limited by pain;Patient limited by fatigue Patient left: in chair;with call bell/phone within reach;with chair alarm set Nurse Communication: Mobility status PT Visit Diagnosis: Muscle weakness (generalized) (M62.81);Difficulty in walking, not elsewhere classified (R26.2);History of falling (Z91.81);Unsteadiness on feet (R26.81)     Time: 5701-7793 PT Time Calculation (min) (ACUTE ONLY): 21 min  Charges:  $Gait Training: 8-22 mins                     Mauro Kaufmann PT Acute Rehabilitation Services Pager [phone] Office [phone]    Toini Failla 02/22/2020, 1:08 PM

## 2020-02-22 NOTE — Evaluation (Signed)
Occupational Therapy Evaluation Patient Details Name: Spencer [name] MRN: [id number] DOB: [birth year] Today's Date: 02/22/2020    History of Present Illness Patient is 74 y.o. male s/p Lt IM nail after mechanical fall at home. PMH significant for HTN.   Clinical Impression   Spencer [name] is a 74 year old man s/p left IM nailing who presents with decreased ROM and strength of LLE, decreased activity tolerance and complaints of pain resulting in a decline of functional abilities. On evaluation patient required mod assist to transfer to side of bed, mod assist to stand from elevated bed height and min assist to steady once in standing. Patient only able to ambulate a short distance before fatiguing. Patient required max assist for lower body dressing, mod assist for lower body bathing and total assist for toileting and set up assistance for upper body ADLs. Patient will benefit from skilled OT services to improve deficits and learn compensatory strategies in order to return home at modified independence.    Follow Up Recommendations  CIR    Equipment Recommendations  Other (comment) (defer to next venue)    Recommendations for Other Services       Precautions / Restrictions Precautions Precautions: Fall Restrictions Weight Bearing Restrictions: No Other Position/Activity Restrictions: WBAT      Mobility Bed Mobility Overal bed mobility: Needs Assistance Bed Mobility: Supine to Sit     Supine to sit: HOB elevated;Mod assist     General bed mobility comments: mod assist and cues to raise trunk with useo f bed rail and assist for LE's off EOB.  Transfers Overall transfer level: Needs assistance Equipment used: Rolling walker (2 wheeled) Transfers: Sit to/from Stand Sit to Stand: Mod assist;+2 safety/equipment;From elevated surface         General transfer comment: cues for safe hand placement/technique with RW, mod assist for power up and min assist to steady once  standing.     Balance Overall balance assessment: Needs assistance Sitting-balance support: No upper extremity supported Sitting balance-Leahy Scale: Fair     Standing balance support: During functional activity;Bilateral upper extremity supported Standing balance-Leahy Scale: Poor                             ADL either performed or assessed with clinical judgement   ADL Overall ADL's : Needs assistance/impaired Eating/Feeding: Independent   Grooming: Wash/dry face;Wash/dry hands;Sitting;Set up   Upper Body Bathing: Set up;Sitting   Lower Body Bathing: Moderate assistance;Set up;Sit to/from stand;Sitting/lateral leans   Upper Body Dressing : Set up;Sitting   Lower Body Dressing: Maximal assistance;Sit to/from stand;+2 for safety/equipment   Toilet Transfer: Ambulation;RW;BSC;Stand-pivot;+2 for safety/equipment;Moderate assistance   Toileting- Clothing Manipulation and Hygiene: Total assistance;+2 for safety/equipment       Functional mobility during ADLs: +2 for safety/equipment;Minimal assistance       Vision   Vision Assessment?: No apparent visual deficits     Perception     Praxis      Pertinent Vitals/Pain Pain Assessment: 0-10 Pain Score: 6  Pain Location: Lt hip Pain Descriptors / Indicators: Aching;Grimacing;Discomfort;Spasm Pain Intervention(s): Limited activity within patient's tolerance;Premedicated before session     Hand Dominance Right   Extremity/Trunk Assessment Upper Extremity Assessment Upper Extremity Assessment: Overall WFL for tasks assessed   Lower Extremity Assessment Lower Extremity Assessment: Defer to PT evaluation   Cervical / Trunk Assessment Cervical / Trunk Assessment: Kyphotic   Communication Communication Communication: No difficulties   Cognition Arousal/Alertness:  Awake/alert Behavior During Therapy: WFL for tasks assessed/performed Overall Cognitive Status: Within Functional Limits for tasks  assessed                                     General Comments       Exercises Exercises: General Lower Extremity General Exercises - Lower Extremity Ankle Circles/Pumps: AROM;Both;20 reps;Seated Quad Sets: AROM;Both;10 reps;Supine Heel Slides: AAROM;Left;15 reps;Supine Hip ABduction/ADduction: AAROM;Left;15 reps;Supine   Shoulder Instructions      Home Living Family/patient expects to be discharged to:: Private residence Living Arrangements: Other relatives (pt's sister) Available Help at Discharge: Family Type of Home: House Home Access: Stairs to enter Secretary/administrator of Steps: 1 Entrance Stairs-Rails: Right Home Layout: One level     Bathroom Shower/Tub: Producer, television/film/video: Handicapped height Bathroom Accessibility: Yes   Home Equipment: Cane - single point;Grab bars - tub/shower;Hand held shower head          Prior Functioning/Environment Level of Independence: Independent        Comments: pt typically ambulates with no device. He still drives, does his shopping, cleaning, cooking, and is independent for ADL's.        OT Problem List: Decreased strength;Decreased range of motion;Decreased activity tolerance;Impaired balance (sitting and/or standing);Decreased knowledge of use of DME or AE;Pain      OT Treatment/Interventions: Self-care/ADL training;Therapeutic exercise;DME and/or AE instruction;Therapeutic activities;Balance training;Patient/family education    OT Goals(Current goals can be found in the care plan section) Acute Rehab OT Goals Patient Stated Goal: get back to being independent OT Goal Formulation: With patient Time For Goal Achievement: 03/07/20 Potential to Achieve Goals: Good  OT Frequency: Min 2X/week   Barriers to D/C:            Co-evaluation PT/OT/SLP Co-Evaluation/Treatment: Yes Reason for Co-Treatment: For patient/therapist safety;To address functional/ADL transfers PT goals addressed  during session: Mobility/safety with mobility OT goals addressed during session: ADL's and self-care      AM-PAC OT "6 Clicks" Daily Activity     Outcome Measure   Help from another person taking care of personal grooming?: A Little Help from another person toileting, which includes using toliet, bedpan, or urinal?: Total Help from another person bathing (including washing, rinsing, drying)?: A Lot Help from another person to put on and taking off regular upper body clothing?: A Little Help from another person to put on and taking off regular lower body clothing?: Total 6 Click Score: 10   End of Session Equipment Utilized During Treatment: Gait belt;Rolling walker Nurse Communication: Mobility status  Activity Tolerance: Patient limited by fatigue Patient left: in chair;with call bell/phone within reach;with chair alarm set  OT Visit Diagnosis: Unsteadiness on feet (R26.81);Other abnormalities of gait and mobility (R26.89);Muscle weakness (generalized) (M62.81);Pain Pain - Right/Left: Right Pain - part of body: Hip                Time: 0920-0938 OT Time Calculation (min): 18 min Charges:  OT General Charges $OT Visit: 1 Visit OT Evaluation $OT Eval Low Complexity: 1 Low  Yarlin Breisch, OTR/L Acute Care Rehab Services  Office [phone] Pager: [phone]   Kelli Churn 02/22/2020, 1:31 PM

## 2020-02-22 NOTE — Progress Notes (Signed)
Physical Therapy Treatment Patient Details Name: Spencer [name] MRN: [id number] DOB: 10-02-1945 Today's Date: 02/22/2020    History of Present Illness Patient is 74 y.o. male s/p Lt IM nail after mechanical fall at home. PMH significant for HTN.    PT Comments    Pt performed gentle therex program with assist - muscle spasms noted and relaxer requested.   Follow Up Recommendations  CIR     Equipment Recommendations  Rolling walker with 5" wheels    Recommendations for Other Services OT consult;Rehab consult     Precautions / Restrictions Precautions Precautions: Fall Restrictions Weight Bearing Restrictions: No Other Position/Activity Restrictions: WBAT    Mobility  Bed Mobility Overal bed mobility: Needs Assistance Bed Mobility: Supine to Sit     Supine to sit: HOB elevated;Mod assist     General bed mobility comments: mod assist and cues to raise trunk with useo f bed rail and assist for LE's off EOB.  Transfers Overall transfer level: Needs assistance Equipment used: Rolling walker (2 wheeled) Transfers: Sit to/from Stand Sit to Stand: Mod assist;+2 safety/equipment;From elevated surface         General transfer comment: cues for safe hand placement/technique with RW, mod assist for power up and min assist to steady once standing.   Ambulation/Gait Ambulation/Gait assistance: Min assist;+2 safety/equipment Gait Distance (Feet): 8 Feet Assistive device: Rolling walker (2 wheeled) Gait Pattern/deviations: Step-to pattern;Decreased stride length;Decreased stance time - left;Decreased step length - left;Decreased weight shift to left;Antalgic;Trunk flexed Gait velocity: decr   General Gait Details: cues for safe step pattern and proximity to RW. cues to improve posture in standing. Min assist to steady intermittently and chair follow for safety.    Stairs             Wheelchair Mobility    Modified Rankin (Stroke Patients Only)       Balance  Overall balance assessment: Needs assistance Sitting-balance support: Feet supported Sitting balance-Leahy Scale: Fair     Standing balance support: During functional activity;Bilateral upper extremity supported Standing balance-Leahy Scale: Poor                              Cognition Arousal/Alertness: Awake/alert Behavior During Therapy: WFL for tasks assessed/performed Overall Cognitive Status: Within Functional Limits for tasks assessed                                        Exercises General Exercises - Lower Extremity Ankle Circles/Pumps: AROM;Both;20 reps;Seated Quad Sets: AROM;Both;10 reps;Supine Heel Slides: AAROM;Left;15 reps;Supine Hip ABduction/ADduction: AAROM;Left;15 reps;Supine    General Comments        Pertinent Vitals/Pain Pain Assessment: 0-10 Pain Score: 6  Pain Location: Lt hip Pain Descriptors / Indicators: Aching;Grimacing;Discomfort;Spasm Pain Intervention(s): Limited activity within patient's tolerance;Monitored during session;Premedicated before session;Ice applied    Home Living                      Prior Function            PT Goals (current goals can now be found in the care plan section) Acute Rehab PT Goals Patient Stated Goal: get back to being independent PT Goal Formulation: With patient Time For Goal Achievement: 02/28/20 Potential to Achieve Goals: Good Progress towards PT goals: Progressing toward goals    Frequency    Min 4X/week  PT Plan Current plan remains appropriate    Co-evaluation              AM-PAC PT "6 Clicks" Mobility   Outcome Measure  Help needed turning from your back to your side while in a flat bed without using bedrails?: A Little Help needed moving from lying on your back to sitting on the side of a flat bed without using bedrails?: A Lot Help needed moving to and from a bed to a chair (including a wheelchair)?: A Lot Help needed standing up from a  chair using your arms (e.g., wheelchair or bedside chair)?: A Lot Help needed to walk in hospital room?: A Lot Help needed climbing 3-5 steps with a railing? : A Lot 6 Click Score: 13    End of Session Equipment Utilized During Treatment: Gait belt Activity Tolerance: Patient limited by pain;Patient limited by fatigue Patient left: in chair;with call bell/phone within reach;with chair alarm set Nurse Communication: Mobility status PT Visit Diagnosis: Muscle weakness (generalized) (M62.81);Difficulty in walking, not elsewhere classified (R26.2);History of falling (Z91.81);Unsteadiness on feet (R26.81)     Time: 1038-1100 PT Time Calculation (min) (ACUTE ONLY): 22 min  Charges:  $Gait Training: 8-22 mins $Therapeutic Exercise: 8-22 mins                     Mauro Kaufmann PT Acute Rehabilitation Services Pager [phone] Office [phone]    Kadir Azucena 02/22/2020, 1:13 PM

## 2020-02-22 NOTE — Plan of Care (Signed)
  Problem: Education: Goal: Knowledge of General Education information will improve Description: Including pain rating scale, medication(s)/side effects and non-pharmacologic comfort measures Outcome: Progressing   Problem: Clinical Measurements: Goal: Will remain free from infection Outcome: Progressing   

## 2020-02-22 NOTE — Progress Notes (Signed)
Physical Therapy Treatment Patient Details Name: Spencer [name] MRN: [id number] DOB: [birth year] Today's Date: 02/22/2020    History of Present Illness Patient is 74 y.o. male s/p Lt IM nail after mechanical fall at home. PMH significant for HTN.    PT Comments    Pt continues very motivated and demonstrating increased speed of movement and increased stride length this pm until onset of R thigh muscle spasms and returned to sitting.     Follow Up Recommendations  CIR     Equipment Recommendations  Rolling walker with 5" wheels    Recommendations for Other Services OT consult;Rehab consult     Precautions / Restrictions Precautions Precautions: Fall Restrictions Weight Bearing Restrictions: No Other Position/Activity Restrictions: WBAT    Mobility  Bed Mobility Overal bed mobility: Needs Assistance Bed Mobility: Supine to Sit     Supine to sit: HOB elevated;Mod assist     General bed mobility comments: Pt up in chair and requests back to same  Transfers Overall transfer level: Needs assistance Equipment used: Rolling walker (2 wheeled) Transfers: Sit to/from Stand Sit to Stand: Min assist;Mod assist;+2 physical assistance;+2 safety/equipment         General transfer comment: cues for LE management and use of UEs to self assist.  Physical assist to bring wt up and fwd and to balance in initial standing  Ambulation/Gait Ambulation/Gait assistance: Min assist;+2 safety/equipment Gait Distance (Feet): 9 Feet Assistive device: Rolling walker (2 wheeled) Gait Pattern/deviations: Step-to pattern;Decreased stride length;Decreased stance time - left;Decreased step length - left;Decreased weight shift to left;Antalgic;Trunk flexed     General Gait Details: cues for posture, position from RW and sequence.  Pt initially with noted improvement in stride length, WB and speed of movement until onset muscle spasms   Stairs             Wheelchair Mobility     Modified Rankin (Stroke Patients Only)       Balance Overall balance assessment: Needs assistance Sitting-balance support: No upper extremity supported Sitting balance-Leahy Scale: Good     Standing balance support: During functional activity;Bilateral upper extremity supported Standing balance-Leahy Scale: Poor                              Cognition Arousal/Alertness: Awake/alert Behavior During Therapy: WFL for tasks assessed/performed Overall Cognitive Status: Within Functional Limits for tasks assessed                                        Exercises General Exercises - Lower Extremity Ankle Circles/Pumps: AROM;Both;20 reps;Seated Quad Sets: AROM;Both;10 reps;Supine Heel Slides: AAROM;Left;15 reps;Supine Hip ABduction/ADduction: AAROM;Left;15 reps;Supine    General Comments        Pertinent Vitals/Pain Pain Assessment: 0-10 Pain Score: 5  Pain Location: Lt hip Pain Descriptors / Indicators: Aching;Grimacing;Discomfort;Spasm Pain Intervention(s): Limited activity within patient's tolerance;Monitored during session;Premedicated before session;Ice applied    Home Living Family/patient expects to be discharged to:: Private residence Living Arrangements: Other relatives (pt's sister) Available Help at Discharge: Family Type of Home: House Home Access: Stairs to enter Entrance Stairs-Rails: Right Home Layout: One level Home Equipment: Cane - single point;Grab bars - tub/shower;Hand held shower head      Prior Function Level of Independence: Independent      Comments: pt typically ambulates with no device. He still drives, does his shopping, cleaning,  cooking, and is independent for ADL's.   PT Goals (current goals can now be found in the care plan section) Acute Rehab PT Goals Patient Stated Goal: get back to being independent PT Goal Formulation: With patient Time For Goal Achievement: 02/28/20 Potential to Achieve Goals:  Good Progress towards PT goals: Progressing toward goals    Frequency    Min 4X/week      PT Plan Current plan remains appropriate    Co-evaluation   Reason for Co-Treatment: For patient/therapist safety;To address functional/ADL transfers PT goals addressed during session: Mobility/safety with mobility OT goals addressed during session: ADL's and self-care      AM-PAC PT "6 Clicks" Mobility   Outcome Measure  Help needed turning from your back to your side while in a flat bed without using bedrails?: A Little Help needed moving from lying on your back to sitting on the side of a flat bed without using bedrails?: A Lot Help needed moving to and from a bed to a chair (including a wheelchair)?: A Lot Help needed standing up from a chair using your arms (e.g., wheelchair or bedside chair)?: A Lot Help needed to walk in hospital room?: A Lot Help needed climbing 3-5 steps with a railing? : A Lot 6 Click Score: 13    End of Session Equipment Utilized During Treatment: Gait belt Activity Tolerance: Patient limited by pain;Patient limited by fatigue Patient left: in chair;with call bell/phone within reach;with chair alarm set;with nursing/sitter in room Nurse Communication: Mobility status PT Visit Diagnosis: Muscle weakness (generalized) (M62.81);Difficulty in walking, not elsewhere classified (R26.2);History of falling (Z91.81);Unsteadiness on feet (R26.81)     Time: 6754-4920 PT Time Calculation (min) (ACUTE ONLY): 18 min  Charges:  $Gait Training: 8-22 mins $Therapeutic Exercise: 8-22 mins                     Mauro Kaufmann PT Acute Rehabilitation Services Pager [phone] Office [phone]    Alyssha Housh 02/22/2020, 3:16 PM

## 2020-02-22 NOTE — Progress Notes (Signed)
Inpatient Rehab Admissions Coordinator:   Case for insurance authorization begun, awaiting updated PT/OT clinicals to fax for review, and therapy is planning to see this patient this AM, if possible. Will continue to follow.  I will not have a bed for this patient to admit to CIR today.   Estill Dooms, PT, DPT Admissions Coordinator [phone] 02/22/20  8:55 AM

## 2020-02-22 NOTE — Progress Notes (Signed)
Subjective: 2 Days Post-Op s/p Procedure(s): INTRAMEDULLARY (IM) NAIL INTERTROCHANTRIC  Patient states pain is severe this morning. No other complaints.   Objective:  PE: VITALS:   Vitals:   02/21/20 0550 02/21/20 1348 02/21/20 2147 02/22/20 0527  BP: 138/80 121/70 133/79 128/76  Pulse: 79 78 88 79  Resp: 16  17 17   Temp: 98.7 F (37.1 C) 98.1 F (36.7 C) 99 F (37.2 C) 99.1 F (37.3 C)  TempSrc: Oral  Oral Oral  SpO2: 92% 97% 97% (!) 89%  Weight:      Height:       General: Alert, oriented, sitting up in bed, in no acute distress GI: abdomen soft, non-tender MSK: LLE - EHL and FHL intact. Able to move all toes of left foot. Distal sensation intact. Surgical dressings intact with mild drainage. Ecchymosis noted anterior and posterior to proximal incision. 2+DP pulse. No edema at hip or knee.    LABS  Results for orders placed or performed during the hospital encounter of 02/19/20 (from the past 24 hour(s))  CBC     Status: Abnormal   Collection Time: 02/22/20  3:43 AM  Result Value Ref Range   WBC 10.7 (H) 4.0 - 10.5 K/uL   RBC 3.45 (L) 4.22 - 5.81 MIL/uL   Hemoglobin 10.8 (L) 13.0 - 17.0 g/dL   HCT 02/24/20 (L) 39 - 52 %   MCV 95.7 80.0 - 100.0 fL   MCH 31.3 26.0 - 34.0 pg   MCHC 32.7 30.0 - 36.0 g/dL   RDW 54.6 27.0 - 35.0 %   Platelets 131 (L) 150 - 400 K/uL   nRBC 0.0 0.0 - 0.2 %  Basic metabolic panel     Status: Abnormal   Collection Time: 02/22/20  3:43 AM  Result Value Ref Range   Sodium 135 135 - 145 mmol/L   Potassium 4.0 3.5 - 5.1 mmol/L   Chloride 102 98 - 111 mmol/L   CO2 26 22 - 32 mmol/L   Glucose, Bld 106 (H) 70 - 99 mg/dL   BUN 18 8 - 23 mg/dL   Creatinine, Ser 02/24/20 0.61 - 1.24 mg/dL   Calcium 8.4 (L) 8.9 - 10.3 mg/dL   GFR calc non Af Amer >60 >60 mL/min   GFR calc Af Amer >60 >60 mL/min   Anion gap 7 5 - 15    Pelvis Portable  Result Date: 02/20/2020 CLINICAL DATA:  ORIF left intertrochanteric fracture. EXAM: PORTABLE PELVIS 1-2  VIEWS COMPARISON:  Concurrent hip exam reported separately. FINDINGS: Intramedullary nail with trans trochanteric screw partially included in the field of view fixating intertrochanteric femur fracture. Recent postsurgical change includes air in the adjacent soft tissues. No other fracture of the pelvis or acute findings. IMPRESSION: ORIF left intertrochanteric femur fracture. No immediate postoperative complication. Electronically Signed   By: 02/22/2020 M.D.   On: 02/20/2020 16:49   DG C-Arm 1-60 Min-No Report  Result Date: 02/20/2020 CLINICAL DATA:  Left intramedullary nail. EXAM: OPERATIVE LEFT HIP (WITH PELVIS IF PERFORMED) 2 VIEWS TECHNIQUE: Fluoroscopic spot image(s) were submitted for interpretation post-operatively. COMPARISON:  Preoperative radiograph. FINDINGS: Four fluoroscopic spot images obtained in the operating room. Intramedullary nail with trans trochanteric and distal locking screw traverse intertrochanteric femur fracture. Total fluoroscopy time 1 minutes 12 seconds. Total dose 31.545 mGy. IMPRESSION: Fluoroscopic spot views after ORIF left intertrochanteric fracture. Electronically Signed   By: 02/22/2020 M.D.   On: 02/20/2020 16:48   DG Hip Port Unilat  With Pelvis 1V Left  Result Date: 02/20/2020 CLINICAL DATA:  Postop hip surgery today. EXAM: DG HIP (WITH OR WITHOUT PELVIS) 1V PORT LEFT COMPARISON:  Radiograph yesterday. FINDINGS: Intramedullary rod with trans trochanteric and distal locking screw fixation of intertrochanteric proximal femur fracture. The fracture is in improved alignment compared to preoperative imaging. No periprosthetic lucency. Recent postsurgical change includes air and edema in the soft tissues. IMPRESSION: Intramedullary rod and screw fixation of intertrochanteric proximal femur fracture, in improved alignment compared to preoperative imaging. No immediate postoperative complication Electronically Signed   By: Narda Rutherford M.D.   On: 02/20/2020  16:45   DG HIP OPERATIVE UNILAT W OR W/O PELVIS LEFT  Result Date: 02/20/2020 CLINICAL DATA:  Left intramedullary nail. EXAM: OPERATIVE LEFT HIP (WITH PELVIS IF PERFORMED) 2 VIEWS TECHNIQUE: Fluoroscopic spot image(s) were submitted for interpretation post-operatively. COMPARISON:  Preoperative radiograph. FINDINGS: Four fluoroscopic spot images obtained in the operating room. Intramedullary nail with trans trochanteric and distal locking screw traverse intertrochanteric femur fracture. Total fluoroscopy time 1 minutes 12 seconds. Total dose 31.545 mGy. IMPRESSION: Fluoroscopic spot views after ORIF left intertrochanteric fracture. Electronically Signed   By: Narda Rutherford M.D.   On: 02/20/2020 16:48    Assessment/Plan: Active Problems:   Closed left hip fracture (HCC)  2 Days Post-Op s/p Procedure(s): INTRAMEDULLARY (IM) NAIL INTERTROCHANTRIC  Weightbearing: WBAT LLE, up with PT Insicional and dressing care: Reinforce dressings as needed, new mepilex dressings placed this AM.  VTE prophylaxis: Lovenox 40mg  qd x 30 days Pain control: continue current regimen Follow - up plan: 2 weeks with Dr. Dispo: PT recommending CIR, ok to discharge to CIR from ortho standpoint when bed available   Contact information:   Weekdays 8-5 01-26-2006, PA-C [phone] A fter hours and holidays please check [email or web address] for group call information for [facility]  [phone] 02/22/2020, 6:34 AM

## 2020-02-23 LAB — CBC
HCT: 33.7 % — ABNORMAL LOW (ref 39.0–52.0)
Hemoglobin: 11.1 g/dL — ABNORMAL LOW (ref 13.0–17.0)
MCH: 31.4 pg (ref 26.0–34.0)
MCHC: 32.9 g/dL (ref 30.0–36.0)
MCV: 95.5 fL (ref 80.0–100.0)
Platelets: 144 10*3/uL — ABNORMAL LOW (ref 150–400)
RBC: 3.53 MIL/uL — ABNORMAL LOW (ref 4.22–5.81)
RDW: 12.3 % (ref 11.5–15.5)
WBC: 10.9 10*3/uL — ABNORMAL HIGH (ref 4.0–10.5)
nRBC: 0 % (ref 0.0–0.2)

## 2020-02-23 NOTE — TOC Initial Note (Signed)
Transition of Care [facility]) - Initial/Assessment Note    Patient Details  Name: Spencer [name] MRN: [id number] Date of Birth: 1946/01/11  Transition of Care [facility]) CM/SW Contact:    Clearance Coots, LCSW Phone Number: 02/23/2020, 1:36 PM  Clinical Narrative:                 [facility] does not have a bed for the patient at this time.  CSW notified the patient and offered SNF rehab placement to the patient and sister Alona Bene. The patient and his sister agreeable to SNF rehab.  FL2/PASRR completed.  CSW follow up with bed offers. Patient preferred [facility] a facility close to his home.  [facility] and co-pay information with the patient. Patient reports understanding.    Expected Discharge Plan: Skilled Nursing Facility Barriers to Discharge: No Barriers Identified   Patient Goals and CMS Choice   CMS Medicare.gov Compare Post Acute Care list provided to:: Patient Choice offered to / list presented to : Patient  Expected Discharge Plan and Services Expected Discharge Plan: Skilled Nursing Facility In-house Referral: Clinical Social Work Discharge Planning Services: CM Consult Post Acute Care Choice: Skilled Nursing Facility Living arrangements for the past 2 months: Single Family Home Expected Discharge Date: 02/23/20               DME Arranged: N/A DME Agency: NA       HH Arranged: NA HH Agency: NA        Prior Living Arrangements/Services Living arrangements for the past 2 months: Single Family Home Lives with:: Self Patient language and need for interpreter reviewed:: No Do you feel safe going back to the place where you live?: Yes      Need for Family Participation in Patient Care: Yes (Comment) Care giver support system in place?: Yes (comment) Current home services: DME Criminal Activity/Legal Involvement Pertinent to Current Situation/Hospitalization: No - Comment as needed  Activities of Daily Living Home Assistive Devices/Equipment:  Eyeglasses ADL Screening (condition at time of admission) Patient's cognitive ability adequate to safely complete daily activities?: Yes Is the patient deaf or have difficulty hearing?: No Does the patient have difficulty seeing, even when wearing glasses/contacts?: No Does the patient have difficulty concentrating, remembering, or making decisions?: No Patient able to express need for assistance with ADLs?: Yes Does the patient have difficulty dressing or bathing?: No Independently performs ADLs?: Yes (appropriate for developmental age) Does the patient have difficulty walking or climbing stairs?: No Weakness of Legs: None Weakness of Arms/Hands: None  Permission Sought/Granted Permission sought to share information with : Case Manager, Magazine features editor, Family Supports Permission granted to share information with : Yes, Verbal Permission Granted  Share Information with NAME: Lawernce Keas  Permission granted to share info w AGENCY: SNF  Permission granted to share info w Relationship: Sister  Permission granted to share info w Contact Information: [phone]  Emotional Assessment Appearance:: Appears stated age Attitude/Demeanor/Rapport: Engaged Affect (typically observed): Accepting, Pleasant Orientation: : Oriented to Self, Oriented to Place, Oriented to  Time, Oriented to Situation Alcohol / Substance Use: Not Applicable Psych Involvement: No (comment)  Admission diagnosis:  Closed left hip fracture (HCC) [S72.002A] Closed displaced intertrochanteric fracture of left femur, initial encounter [facility]) [S72.142A] Patient Active Problem List   Diagnosis Date Noted  . Closed left hip fracture (HCC) 02/19/2020   PCP:  [facility] Medical Pharmacy:   [id number] - [place], [place] - [address] AT ACROSS [address]  [place] [address] [place] [address] Phone: [phone] Fax: [phone]     Social Determinants of Health  (SDOH) Interventions    Readmission Risk Interventions No flowsheet data found.

## 2020-02-23 NOTE — Progress Notes (Signed)
Physical Therapy Treatment Patient Details Name: Spencer [name] MRN: [id number] DOB: 1946/01/28 Today's Date: 02/23/2020    History of Present Illness Patient is 74 y.o. male s/p Lt IM nail after mechanical fall at home. PMH significant for HTN.    PT Comments    Pt very cooperative and with noted improvement in activity tolerance.   Follow Up Recommendations  SNF     Equipment Recommendations  Rolling walker with 5" wheels    Recommendations for Other Services OT consult     Precautions / Restrictions Precautions Precautions: Fall Restrictions Weight Bearing Restrictions: No Other Position/Activity Restrictions: WBAT    Mobility  Bed Mobility               General bed mobility comments: Pt up in chair and requests back to same  Transfers Overall transfer level: Needs assistance Equipment used: Rolling walker (2 wheeled) Transfers: Sit to/from Stand Sit to Stand: Min assist;Mod assist         General transfer comment: cues for LE management and use of UEs to self assist.  Physical assist to bring wt up and fwd and to balance in initial standing  Ambulation/Gait Ambulation/Gait assistance: Min assist Gait Distance (Feet): 39 Feet Assistive device: Rolling walker (2 wheeled) Gait Pattern/deviations: Step-to pattern;Decreased stride length;Decreased stance time - left;Decreased step length - left;Decreased weight shift to left;Antalgic;Trunk flexed Gait velocity: decr   General Gait Details: cues for posture, position from RW and sequence.     Stairs             Wheelchair Mobility    Modified Rankin (Stroke Patients Only)       Balance Overall balance assessment: Needs assistance Sitting-balance support: No upper extremity supported Sitting balance-Leahy Scale: Good     Standing balance support: During functional activity;Bilateral upper extremity supported Standing balance-Leahy Scale: Poor                               Cognition Arousal/Alertness: Awake/alert Behavior During Therapy: WFL for tasks assessed/performed Overall Cognitive Status: Within Functional Limits for tasks assessed                                        Exercises      General Comments        Pertinent Vitals/Pain Pain Assessment: 0-10 Pain Score: 5  Pain Location: Lt hip Pain Descriptors / Indicators: Aching;Grimacing;Discomfort;Spasm Pain Intervention(s): Limited activity within patient's tolerance;Monitored during session;Premedicated before session;Ice applied    Home Living                      Prior Function            PT Goals (current goals can now be found in the care plan section) Acute Rehab PT Goals Patient Stated Goal: get back to being independent PT Goal Formulation: With patient Time For Goal Achievement: 02/28/20 Potential to Achieve Goals: Good Progress towards PT goals: Progressing toward goals    Frequency    Min 4X/week      PT Plan Discharge plan needs to be updated    Co-evaluation              AM-PAC PT "6 Clicks" Mobility   Outcome Measure  Help needed turning from your back to your side while in a flat bed without  using bedrails?: A Little Help needed moving from lying on your back to sitting on the side of a flat bed without using bedrails?: A Lot Help needed moving to and from a bed to a chair (including a wheelchair)?: A Lot Help needed standing up from a chair using your arms (e.g., wheelchair or bedside chair)?: A Lot Help needed to walk in hospital room?: A Little Help needed climbing 3-5 steps with a railing? : A Lot 6 Click Score: 14    End of Session Equipment Utilized During Treatment: Gait belt Activity Tolerance: Patient limited by pain;Patient limited by fatigue Patient left: in chair;with call bell/phone within reach;with chair alarm set;with nursing/sitter in room Nurse Communication: Mobility status PT Visit Diagnosis: Muscle  weakness (generalized) (M62.81);Difficulty in walking, not elsewhere classified (R26.2);History of falling (Z91.81);Unsteadiness on feet (R26.81)     Time: 7867-6720 PT Time Calculation (min) (ACUTE ONLY): 19 min  Charges:  $Gait Training: 8-22 mins                     Mauro Kaufmann PT Acute Rehabilitation Services Pager [phone] Office [phone]    Tory Septer 02/23/2020, 2:16 PM

## 2020-02-23 NOTE — Progress Notes (Signed)
Discharge Summary  Spencer [name] BJS:[id number] DOB: Sep 27, 1945  PCP: [facility]  Admit date: 02/19/2020 Discharge date: 02/23/2020  Time spent: 35 minutes   Recommendations for Outpatient Follow-up:  1. Follow-up with orthopedic surgery 2. Follow-up with your primary care provider 3. Take medications as prescribed. 4. Continue PT OT with assistance and fall precautions at inpatient rehab.  Discharge Diagnoses:  Active Hospital Problems   Diagnosis Date Noted  . Closed left hip fracture (HCC) 02/19/2020    Resolved Hospital Problems  No resolved problems to display.    Discharge Condition: Stable  Diet recommendation: Resume previous diet.  Vitals:   02/22/20 2125 02/23/20 0550  BP: 132/79 135/76  Pulse: 79 84  Resp: 18 17  Temp: 98.1 F (36.7 C) 98.7 F (37.1 C)  SpO2: 94% 93%    History of present illness:  Spencer [name] a 74 y.o.malewith medical history significant ofHTN. Presents with hip pain after falls. He states he was loading his wheelbarrow yesterday. As he threw a bag into the wheelbarrow, it overturned and pulled him with it. In the process he landed on his left side and immediately had pain. He denies LOC, but did bump his head. He remembers the entire fall. He reported inability to walk since the fall. He denies any additional alleviating or aggravating factors. He called for transport to [facility] ED.  ED Course:He was found to have an intertrochanteric fracture. Ortho was consulted and rec'd transfer to [facility]. [facility] was called for admission.  POD#3 post left hip surgical repair Post-Ops/p Procedure(s): INTRAMEDULLARY (IM) NAIL INTERTROCHANTRIC on 02/20/20.  02/23/20: No new complaints.  Left hip pain is well controlled.  Had a bowel movement this morning.  Awaiting placement.  No beds available at inpatient rehab over the weekend.  Appreciate TOC's assistance.    Hospital Course:  Active Problems:   Closed left hip fracture  (HCC)  Closed left hip fracture s/p left hip intramedullary nail fixation by Dr. Dion Saucier on 02/20/20 PT recs CIR, no beds available over the weekend. Pain control Recommendation per ortho: Weightbearing:WBATLLE, up with PT, does not walk with assistive device at baseline Insicional and dressing care:Reinforce dressings as needed, multiple ABD's applied post operatively due to significant drainage. Will plan to remove and change dressings tomorrow AM. VTE prophylaxis:Lovenox 40mg  qdx 30 days Pain control:continue current regimen Follow - up plan:2 weekswith Dr. Dispo: pending PT eval, patient lives at home with sister  Essential HTN BP is stable Continue home bystolic Monitor vital signs  DVT prophylaxis:lovenoxSQ daily Code Status:FULL Family Communication:None at bedside Consults called:Orthopedic surgery.    Procedures: s/p left hip intramedullary nail fixation by Dr. Dion Saucier on 02/20/20   Discharge Exam: BP 135/76 (BP Location: Left Arm)   Pulse 84   Temp 98.7 F (37.1 C) (Oral)   Resp 17   Ht 6\' 3"  (1.905 m)   Wt 119.7 kg   SpO2 93%   BMI 33.00 kg/m  . General: 74 y.o. year-old male well developed well nourished in no acute distress.  Alert and oriented x3. . Cardiovascular: Regular rate and rhythm with no rubs or gallops.  No thyromegaly or JVD noted.   Respiratory: Clear to auscultation with no wheezes or rales. Good inspiratory effort. . Abdomen: Soft nontender nondistended with normal bowel sounds x4 quadrants. . Musculoskeletal: No lower extremity edema. 2/4 pulses in all 4 extremities. 61 Psychiatry: Mood is appropriate for condition and setting  Discharge Instructions You were cared for by a hospitalist during  your hospital stay. If you have any questions about your discharge medications or the care you received while you were in the hospital after you are discharged, you can call the unit and asked to speak with the hospitalist on call  if the hospitalist that took care of you is not available. Once you are discharged, your primary care physician will handle any further medical issues. Please note that NO REFILLS for any discharge medications will be authorized once you are discharged, as it is imperative that you return to your primary care physician (or establish a relationship with a primary care physician if you do not have one) for your aftercare needs so that they can reassess your need for medications and monitor your lab values.   Allergies as of 02/23/2020   No Known Allergies     Medication List    TAKE these medications   enoxaparin 40 MG/0.4ML injection Commonly known as: Lovenox Inject 0.4 mLs (40 mg total) into the skin daily.   feeding supplement (ENSURE ENLIVE) Liqd Take 237 mLs by mouth 2 (two) times daily between meals for 7 days.   ferrous sulfate 325 (65 FE) MG tablet Take 1 tablet (325 mg total) by mouth 2 (two) times daily with a meal.   HYDROcodone-acetaminophen 5-325 MG tablet Commonly known as: NORCO/VICODIN Take 1-2 tablets by mouth 3 (three) times daily as needed for up to 3 days for severe pain (pain score 4-6).   methocarbamol 500 MG tablet Commonly known as: ROBAXIN Take 1 tablet (500 mg total) by mouth every 6 (six) hours as needed for muscle spasms.   nebivolol 10 MG tablet Commonly known as: BYSTOLIC Take 10 mg by mouth daily.   polyethylene glycol 17 g packet Commonly known as: MIRALAX / GLYCOLAX Take 17 g by mouth daily.   rosuvastatin 40 MG tablet Commonly known as: CRESTOR Take 40 mg by mouth at bedtime.   Vitamin D (Ergocalciferol) 1.25 MG (50000 UNIT) Caps capsule Commonly known as: DRISDOL Take 50,000 Units by mouth once a week.            Durable Medical Equipment  (From admission, onward)         Start     Ordered   02/23/20 0700  For home use only DME Walker rolling  Once       Question Answer Comment  Walker: With 5 Inch Wheels   Patient needs a  walker to treat with the following condition Ambulatory dysfunction      02/23/20 0503         No Known Allergies  Follow-up Information    Teryl Lucy, MD. Schedule an appointment as soon as possible for a visit in 2 weeks.   Specialty: Orthopedic Surgery Contact information: [address] [place] [address] [phone]        [facility]. Call in 1 day(s).   Why: please call for a post hospital follow up appointment  Contact information: [address] [place] [facility] [place] [address] [phone]                The results of significant diagnostics from this hospitalization (including imaging, microbiology, ancillary and laboratory) are listed below for reference.    Significant Diagnostic Studies: CT Head Wo Contrast  Result Date: 02/19/2020 CLINICAL DATA:  Fall while unloading wheelbarrow EXAM: CT HEAD WITHOUT CONTRAST TECHNIQUE: Contiguous axial images were obtained from the base of the skull through the vertex without intravenous contrast. COMPARISON:  None. FINDINGS: Brain: No evidence of acute infarction, hemorrhage, hydrocephalus, extra-axial collection, visible mass lesion or mass effect. Symmetric prominence of the ventricles, cisterns and sulci compatible with parenchymal volume loss. Patchy areas of white matter hypoattenuation are most compatible with chronic microvascular angiopathy. Vascular: Atherosclerotic calcification of the carotid siphons. No hyperdense vessel. Skull: No calvarial fracture or suspicious osseous lesion. No scalp swelling or hematoma. Sinuses/Orbits: Paranasal sinuses and mastoid air cells are predominantly clear. Included orbital structures are unremarkable. Other: None. IMPRESSION: 1. No acute intracranial abnormality. No scalp swelling or calvarial fracture. 2. Mild parenchymal volume loss and chronic microvascular angiopathy changes. Electronically Signed   By: Kreg Shropshire M.D.   On: 02/19/2020 22:26    Pelvis Portable  Result Date: 02/20/2020 CLINICAL DATA:  ORIF left intertrochanteric fracture. EXAM: PORTABLE PELVIS 1-2 VIEWS COMPARISON:  Concurrent hip exam reported separately. FINDINGS: Intramedullary nail with trans trochanteric screw partially included in the field of view fixating intertrochanteric femur fracture. Recent postsurgical change includes air in the adjacent soft tissues. No other fracture of the pelvis or acute findings. IMPRESSION: ORIF left intertrochanteric femur fracture. No immediate postoperative complication. Electronically Signed   By: Narda Rutherford M.D.   On: 02/20/2020 16:49   DG Chest Port 1 View  Result Date: 02/19/2020 CLINICAL DATA:  Hip fracture after a fall EXAM: PORTABLE CHEST 1 VIEW COMPARISON:  None. FINDINGS: Shallow inspiration. Heart size and pulmonary vascularity are normal for technique. Vascular crowding or linear atelectasis in the lung bases. No consolidation or edema. No pleural effusions. No pneumothorax. Mediastinal contours appear intact. IMPRESSION: Shallow inspiration with vascular crowding or linear atelectasis in the lung bases. Electronically Signed   By: Burman Nieves M.D.   On: 02/19/2020 23:02   DG C-Arm 1-60 Min-No Report  Result Date: 02/20/2020 CLINICAL DATA:  Left intramedullary nail. EXAM: OPERATIVE LEFT HIP (WITH PELVIS IF PERFORMED) 2 VIEWS TECHNIQUE: Fluoroscopic spot image(s) were submitted for interpretation post-operatively. COMPARISON:  Preoperative radiograph. FINDINGS: Four fluoroscopic spot images obtained in the operating room. Intramedullary nail with trans trochanteric and distal locking screw traverse intertrochanteric femur fracture. Total fluoroscopy time 1 minutes 12 seconds. Total dose 31.545 mGy. IMPRESSION: Fluoroscopic spot views after ORIF left intertrochanteric fracture. Electronically Signed   By: Narda Rutherford M.D.   On: 02/20/2020 16:48   DG Hip Port Unilat With Pelvis 1V Left  Result Date:  02/20/2020 CLINICAL DATA:  Postop hip surgery today. EXAM: DG HIP (WITH OR WITHOUT PELVIS) 1V PORT LEFT COMPARISON:  Radiograph yesterday. FINDINGS: Intramedullary rod with trans trochanteric and distal locking screw fixation of intertrochanteric proximal femur fracture. The fracture is in improved alignment compared to preoperative imaging. No periprosthetic lucency. Recent postsurgical change includes air and edema in the soft tissues. IMPRESSION: Intramedullary rod and screw fixation of intertrochanteric proximal femur fracture, in improved alignment compared to preoperative imaging. No immediate postoperative complication Electronically Signed   By: Narda Rutherford M.D.   On: 02/20/2020 16:45   DG HIP OPERATIVE UNILAT W OR W/O PELVIS LEFT  Result Date: 02/20/2020 CLINICAL DATA:  Left intramedullary nail. EXAM: OPERATIVE LEFT HIP (WITH PELVIS IF PERFORMED) 2 VIEWS TECHNIQUE: Fluoroscopic spot image(s) were submitted for interpretation post-operatively. COMPARISON:  Preoperative radiograph. FINDINGS: Four fluoroscopic spot images obtained in the operating room. Intramedullary nail with trans trochanteric and distal locking screw traverse intertrochanteric femur fracture. Total fluoroscopy time 1 minutes 12 seconds. Total dose 31.545 mGy. IMPRESSION: Fluoroscopic spot views after ORIF left intertrochanteric fracture. Electronically Signed   By: Shawna Orleans  Sanford M.D.   On: 02/20/2020 16:48   DG Hip Unilat With Pelvis 2-3 Views Left  Result Date: 02/19/2020 CLINICAL DATA:  Post fall with left hip pain. EXAM: DG HIP (WITH OR WITHOUT PELVIS) 2-3V LEFT COMPARISON:  None. FINDINGS: Displaced intertrochanteric left femur fracture. Fracture involves the greater trochanter with mild combination. There is proximal migration of the femoral shaft. Femoral head remains seated. Pubic rami are intact. No additional fracture of the pelvis. IMPRESSION: Displaced intertrochanteric left femur fracture. Electronically Signed    By: Narda Rutherford M.D.   On: 02/19/2020 22:28    Microbiology: Recent Results (from the past 240 hour(s))  Respiratory Panel by RT PCR (Flu A&B, Covid) - Nasopharyngeal Swab     Status: None   Collection Time: 02/19/20 10:59 PM   Specimen: Nasopharyngeal Swab  Result Value Ref Range Status   SARS Coronavirus 2 by RT PCR NEGATIVE NEGATIVE Final    Comment: (NOTE) SARS-CoV-2 target nucleic acids are NOT DETECTED.  The SARS-CoV-2 RNA is generally detectable in upper respiratoy specimens during the acute phase of infection. The lowest concentration of SARS-CoV-2 viral copies this assay can detect is 131 copies/mL. A negative result does not preclude SARS-Cov-2 infection and should not be used as the sole basis for treatment or other patient management decisions. A negative result may occur with  improper specimen collection/handling, submission of specimen other than nasopharyngeal swab, presence of viral mutation(s) within the areas targeted by this assay, and inadequate number of viral copies (<131 copies/mL). A negative result must be combined with clinical observations, patient history, and epidemiological information. The expected result is Negative.  Fact Sheet for Patients:  https://www.moore.com/  Fact Sheet for Healthcare Providers:  https://www.young.biz/  This test is no t yet approved or cleared by the [place] FDA and  has been authorized for detection and/or diagnosis of SARS-CoV-2 by FDA under an Emergency Use Authorization (EUA). This EUA will remain  in effect (meaning this test can be used) for the duration of the COVID-19 declaration under Section 564(b)(1) of the Act, 21 U.S.C. section 360bbb-3(b)(1), unless the authorization is terminated or revoked sooner.     Influenza A by PCR NEGATIVE NEGATIVE Final   Influenza B by PCR NEGATIVE NEGATIVE Final    Comment: (NOTE) The Xpert Xpress SARS-CoV-2/FLU/RSV assay is  intended as an aid in  the diagnosis of influenza from Nasopharyngeal swab specimens and  should not be used as a sole basis for treatment. Nasal washings and  aspirates are unacceptable for Xpert Xpress SARS-CoV-2/FLU/RSV  testing.  Fact Sheet for Patients: https://www.moore.com/  Fact Sheet for Healthcare Providers: https://www.young.biz/  This test is not yet approved or cleared by the [place] FDA and  has been authorized for detection and/or diagnosis of SARS-CoV-2 by  FDA under an Emergency Use Authorization (EUA). This EUA will remain  in effect (meaning this test can be used) for the duration of the  Covid-19 declaration under Section 564(b)(1) of the Act, 21  U.S.C. section 360bbb-3(b)(1), unless the authorization is  terminated or revoked. Performed at [facility], [address] [place], [place] [address]      Labs: Basic Metabolic Panel: Recent Labs  Lab 02/19/20 2259 02/21/20 0320 02/22/20 0343  NA 141 137 135  K 4.3 4.6 4.0  CL 106 105 102  CO2 27 25 26   GLUCOSE 133* 134* 106*  BUN 17 16 18   CREATININE 1.06 0.99 0.92  CALCIUM 9.0 8.6* 8.4*   Liver  Function Tests: No results for input(s): AST, ALT, ALKPHOS, BILITOT, PROT, ALBUMIN in the last 168 hours. No results for input(s): LIPASE, AMYLASE in the last 168 hours. No results for input(s): AMMONIA in the last 168 hours. CBC: Recent Labs  Lab 02/19/20 2259 02/21/20 0320 02/22/20 0343 02/23/20 0403  WBC 10.1 10.5 10.7* 10.9*  NEUTROABS 8.7*  --   --   --   HGB 14.2 11.7* 10.8* 11.1*  HCT 43.3 35.5* 33.0* 33.7*  MCV 94.5 95.2 95.7 95.5  PLT 158 143* 131* 144*   Cardiac Enzymes: No results for input(s): CKTOTAL, CKMB, CKMBINDEX, TROPONINI in the last 168 hours. BNP: BNP (last 3 results) No results for input(s): BNP in the last 8760 hours.  ProBNP (last 3 results) No results for input(s): PROBNP in the last 8760 hours.  CBG: No results  for input(s): GLUCAP in the last 168 hours.     Signed:  Darlin Droparole N Zakeya Junker, MD [facility] 02/23/2020, 12:48 PM

## 2020-02-23 NOTE — Progress Notes (Signed)
     Subjective: 3 Days Post-Op s/p Procedure(s): INTRAMEDULLARY (IM) NAIL INTERTROCHANTRIC   Patient sitting up in bed. States hip feels stiff, but pain well controlled.   Objective:  PE: VITALS:   Vitals:   02/22/20 0527 02/22/20 1114 02/22/20 2125 02/23/20 0550  BP: 128/76 126/87 132/79 135/76  Pulse: 79 84 79 84  Resp: 17 14 18 17   Temp: 99.1 F (37.3 C) 99.1 F (37.3 C) 98.1 F (36.7 C) 98.7 F (37.1 C)  TempSrc: Oral Oral Oral Oral  SpO2: (!) 89% 94% 94% 93%  Weight:      Height:       General: sitting up on side of bed, in no acute distress GI: abdomen soft, non-tender MSK: LLE - EHL and FHL intact. Able to move all toes of left foot. Distal sensation intact. Surgical dressings intact with mild drainage. Ecchymosis noted anterior and posterior to proximal incision. 2+ DP pulse. No edema at hip or knee  LABS  Results for orders placed or performed during the hospital encounter of 02/19/20 (from the past 24 hour(s))  CBC     Status: Abnormal   Collection Time: 02/23/20  4:03 AM  Result Value Ref Range   WBC 10.9 (H) 4.0 - 10.5 K/uL   RBC 3.53 (L) 4.22 - 5.81 MIL/uL   Hemoglobin 11.1 (L) 13.0 - 17.0 g/dL   HCT 04/24/20 (L) 39 - 52 %   MCV 95.5 80.0 - 100.0 fL   MCH 31.4 26.0 - 34.0 pg   MCHC 32.9 30.0 - 36.0 g/dL   RDW 35.3 61.4 - 43.1 %   Platelets 144 (L) 150 - 400 K/uL   nRBC 0.0 0.0 - 0.2 %    No results found.  Assessment/Plan: Active Problems:   Closed left hip fracture (HCC)    3 Days Post-Op s/p Procedure(s): INTRAMEDULLARY (IM) NAIL INTERTROCHANTRIC - doing well, likely with some hematoma formation near the proximal incision site Weightbearing:WBATLLE, up with PT Insicional and dressing care:Reinforce dressings as needed, new mepilex dressings placed this AM.  VTE prophylaxis:Lovenox 40mg  qdx 30 days Pain control:continue current regimen Follow - up plan:2 weekswith Dr. 54.0 Dispo: PT recommending CIR  Contact information:     Weekdays 8-5 Dion Saucier, PA-C [phone] A fter hours and holidays please check [email or web address] for group call information for [facility]  Janine Ores 02/23/2020, 12:46 PM

## 2020-02-23 NOTE — Discharge Summary (Signed)
Discharge Summary  Spencer [name] NFA:[id number] DOB: [birth year]  PCP: [facility]  Admit date: 02/19/2020 Discharge date: 02/23/2020  Time spent: 35 minutes   Recommendations for Outpatient Follow-up:  1. Follow-up with orthopedic surgery 2. Follow-up with your primary care provider 3. Take medications as prescribed. 4. Continue PT OT with assistance and fall precautions at inpatient rehab.  Discharge Diagnoses:  Active Hospital Problems   Diagnosis Date Noted  . Closed left hip fracture (HCC) 02/19/2020    Resolved Hospital Problems  No resolved problems to display.    Discharge Condition: Stable  Diet recommendation: Resume previous diet.  Vitals:   02/22/20 2125 02/23/20 0550  BP: 132/79 135/76  Pulse: 79 84  Resp: 18 17  Temp: 98.1 F (36.7 C) 98.7 F (37.1 C)  SpO2: 94% 93%    History of present illness:  ION:[email or web address]:Dyshaun Millsis a 74 y.o.malewith medical history significant ofHTN. Presents with hip pain after falls. He states he was loading his wheelbarrow yesterday. As he threw a bag into the wheelbarrow, it overturned and pulled him with it. In the process he landed on his left side and immediately had pain. He denies LOC, but did bump his head. He remembers the entire fall. He reported inability to walk since the fall. He denies any additional alleviating or aggravating factors. [facility].  ED Course:He was found to have an intertrochanteric fracture. Ortho was consulted and [facility] was called for admission.  POD#3 post left hip surgical repair Post-Ops/p Procedure(s): INTRAMEDULLARY (IM) NAIL INTERTROCHANTRIC on 02/20/20.  02/23/20: No new complaints.  Left hip pain is well controlled.  Had a bowel movement this morning.  Awaiting placement.  No beds available at inpatient rehab over the weekend.  Appreciate TOC's assistance.    Hospital Course:  Active Problems:   Closed left hip fracture  (HCC)  Closed left hip fracture s/p left hip intramedullary nail fixation by Dr. Dion SaucierLandau on 02/20/20 PT recs CIR, no beds available over the weekend. Pain control Recommendation per ortho: Weightbearing:WBATLLE, up with PT, does not walk with assistive device at baseline Insicional and dressing care:Reinforce dressings as needed, multiple ABD's applied post operatively due to significant drainage. Will plan to remove and change dressings tomorrow AM. VTE prophylaxis:Lovenox 40mg  qdx 30 days Pain control:continue current regimen Follow - up plan:2 weekswith Dr. Dion SaucierLandau Dispo: pending PT eval, patient lives at home with sister  Essential HTN BP is stable Continue home bystolic Monitor vital signs   Code Status:FULL  Consults called:Orthopedic surgery.    Procedures: s/p left hip intramedullary nail fixation by Dr. Dion SaucierLandau on 02/20/20   Discharge Exam: BP 135/76 (BP Location: Left Arm)   Pulse 84   Temp 98.7 F (37.1 C) (Oral)   Resp 17   Ht 6\' 3"  (1.905 m)   Wt 119.7 kg   SpO2 93%   BMI 33.00 kg/m  . General: 74 y.o. year-old male well developed well nourished in no acute distress.  Alert and oriented x3. . Cardiovascular: Regular rate and rhythm with no rubs or gallops.  No thyromegaly or JVD noted.   Marland Kitchen. Respiratory: Clear to auscultation with no wheezes or rales. Good inspiratory effort. . Abdomen: Soft nontender nondistended with normal bowel sounds x4 quadrants. . Musculoskeletal: No lower extremity edema bilaterally. Marland Kitchen. Psychiatry: Mood is appropriate for condition and setting  Discharge Instructions You were cared for by a hospitalist during your hospital stay. If you have any questions about your  discharge medications or the care you received while you were in the hospital after you are discharged, you can call the unit and asked to speak with the hospitalist on call if the hospitalist that took care of you is not available. Once you are discharged, your  primary care physician will handle any further medical issues. Please note that NO REFILLS for any discharge medications will be authorized once you are discharged, as it is imperative that you return to your primary care physician (or establish a relationship with a primary care physician if you do not have one) for your aftercare needs so that they can reassess your need for medications and monitor your lab values.   Allergies as of 02/23/2020   No Known Allergies     Medication List    TAKE these medications   enoxaparin 40 MG/0.4ML injection Commonly known as: Lovenox Inject 0.4 mLs (40 mg total) into the skin daily.   feeding supplement (ENSURE ENLIVE) Liqd Take 237 mLs by mouth 2 (two) times daily between meals for 7 days.   ferrous sulfate 325 (65 FE) MG tablet Take 1 tablet (325 mg total) by mouth 2 (two) times daily with a meal.   HYDROcodone-acetaminophen 5-325 MG tablet Commonly known as: NORCO/VICODIN Take 1-2 tablets by mouth 3 (three) times daily as needed for up to 3 days for severe pain (pain score 4-6).   methocarbamol 500 MG tablet Commonly known as: ROBAXIN Take 1 tablet (500 mg total) by mouth every 6 (six) hours as needed for muscle spasms.   nebivolol 10 MG tablet Commonly known as: BYSTOLIC Take 10 mg by mouth daily.   polyethylene glycol 17 g packet Commonly known as: MIRALAX / GLYCOLAX Take 17 g by mouth daily.   rosuvastatin 40 MG tablet Commonly known as: CRESTOR Take 40 mg by mouth at bedtime.   Vitamin D (Ergocalciferol) 1.25 MG (50000 UNIT) Caps capsule Commonly known as: DRISDOL Take 50,000 Units by mouth once a week.            Durable Medical Equipment  (From admission, onward)         Start     Ordered   02/23/20 0700  For home use only DME Walker rolling  Once       Question Answer Comment  Walker: With 5 Inch Wheels   Patient needs a walker to treat with the following condition Ambulatory dysfunction      02/23/20 0503          No Known Allergies  Contact information for follow-up providers    Teryl Lucy, MD. Schedule an appointment as soon as possible for a visit in 2 weeks.   Specialty: Orthopedic Surgery Contact information: [address] [place] [address] [phone]        [facility]. Call in 1 day(s).   Why: please call for a post hospital follow up appointment  Contact information: [address] [place] [facility] [place] [address] [phone]            Contact information for after-discharge care    Destination    [facility] Preferred SNF .   Service: Skilled Nursing Contact information: [address] [place] [address] [phone]                   The results of significant diagnostics from this hospitalization (including imaging, microbiology, ancillary and laboratory) are listed below for reference.    Significant  Diagnostic Studies: CT Head Wo Contrast  Result Date: 02/19/2020 CLINICAL DATA:  Fall while unloading wheelbarrow EXAM: CT HEAD WITHOUT CONTRAST TECHNIQUE: Contiguous axial images were obtained from the base of the skull through the vertex without intravenous contrast. COMPARISON:  None. FINDINGS: Brain: No evidence of acute infarction, hemorrhage, hydrocephalus, extra-axial collection, visible mass lesion or mass effect. Symmetric prominence of the ventricles, cisterns and sulci compatible with parenchymal volume loss. Patchy areas of white matter hypoattenuation are most compatible with chronic microvascular angiopathy. Vascular: Atherosclerotic calcification of the carotid siphons. No hyperdense vessel. Skull: No calvarial fracture or suspicious osseous lesion. No scalp swelling or hematoma. Sinuses/Orbits: Paranasal sinuses and mastoid air cells are predominantly clear. Included orbital structures are unremarkable. Other: None. IMPRESSION: 1. No acute intracranial abnormality. No scalp  swelling or calvarial fracture. 2. Mild parenchymal volume loss and chronic microvascular angiopathy changes. Electronically Signed   By: Kreg Shropshire M.D.   On: 02/19/2020 22:26   Pelvis Portable  Result Date: 02/20/2020 CLINICAL DATA:  ORIF left intertrochanteric fracture. EXAM: PORTABLE PELVIS 1-2 VIEWS COMPARISON:  Concurrent hip exam reported separately. FINDINGS: Intramedullary nail with trans trochanteric screw partially included in the field of view fixating intertrochanteric femur fracture. Recent postsurgical change includes air in the adjacent soft tissues. No other fracture of the pelvis or acute findings. IMPRESSION: ORIF left intertrochanteric femur fracture. No immediate postoperative complication. Electronically Signed   By: Narda Rutherford M.D.   On: 02/20/2020 16:49   DG Chest Port 1 View  Result Date: 02/19/2020 CLINICAL DATA:  Hip fracture after a fall EXAM: PORTABLE CHEST 1 VIEW COMPARISON:  None. FINDINGS: Shallow inspiration. Heart size and pulmonary vascularity are normal for technique. Vascular crowding or linear atelectasis in the lung bases. No consolidation or edema. No pleural effusions. No pneumothorax. Mediastinal contours appear intact. IMPRESSION: Shallow inspiration with vascular crowding or linear atelectasis in the lung bases. Electronically Signed   By: Burman Nieves M.D.   On: 02/19/2020 23:02   DG C-Arm 1-60 Min-No Report  Result Date: 02/20/2020 CLINICAL DATA:  Left intramedullary nail. EXAM: OPERATIVE LEFT HIP (WITH PELVIS IF PERFORMED) 2 VIEWS TECHNIQUE: Fluoroscopic spot image(s) were submitted for interpretation post-operatively. COMPARISON:  Preoperative radiograph. FINDINGS: Four fluoroscopic spot images obtained in the operating room. Intramedullary nail with trans trochanteric and distal locking screw traverse intertrochanteric femur fracture. Total fluoroscopy time 1 minutes 12 seconds. Total dose 31.545 mGy. IMPRESSION: Fluoroscopic spot views after  ORIF left intertrochanteric fracture. Electronically Signed   By: Narda Rutherford M.D.   On: 02/20/2020 16:48   DG Hip Port Unilat With Pelvis 1V Left  Result Date: 02/20/2020 CLINICAL DATA:  Postop hip surgery today. EXAM: DG HIP (WITH OR WITHOUT PELVIS) 1V PORT LEFT COMPARISON:  Radiograph yesterday. FINDINGS: Intramedullary rod with trans trochanteric and distal locking screw fixation of intertrochanteric proximal femur fracture. The fracture is in improved alignment compared to preoperative imaging. No periprosthetic lucency. Recent postsurgical change includes air and edema in the soft tissues. IMPRESSION: Intramedullary rod and screw fixation of intertrochanteric proximal femur fracture, in improved alignment compared to preoperative imaging. No immediate postoperative complication Electronically Signed   By: Narda Rutherford M.D.   On: 02/20/2020 16:45   DG HIP OPERATIVE UNILAT W OR W/O PELVIS LEFT  Result Date: 02/20/2020 CLINICAL DATA:  Left intramedullary nail. EXAM: OPERATIVE LEFT HIP (WITH PELVIS IF PERFORMED) 2 VIEWS TECHNIQUE: Fluoroscopic spot image(s) were submitted for interpretation post-operatively. COMPARISON:  Preoperative radiograph. FINDINGS: Four fluoroscopic spot images obtained in the  operating room. Intramedullary nail with trans trochanteric and distal locking screw traverse intertrochanteric femur fracture. Total fluoroscopy time 1 minutes 12 seconds. Total dose 31.545 mGy. IMPRESSION: Fluoroscopic spot views after ORIF left intertrochanteric fracture. Electronically Signed   By: Narda Rutherford M.D.   On: 02/20/2020 16:48   DG Hip Unilat With Pelvis 2-3 Views Left  Result Date: 02/19/2020 CLINICAL DATA:  Post fall with left hip pain. EXAM: DG HIP (WITH OR WITHOUT PELVIS) 2-3V LEFT COMPARISON:  None. FINDINGS: Displaced intertrochanteric left femur fracture. Fracture involves the greater trochanter with mild combination. There is proximal migration of the femoral shaft.  Femoral head remains seated. Pubic rami are intact. No additional fracture of the pelvis. IMPRESSION: Displaced intertrochanteric left femur fracture. Electronically Signed   By: Narda Rutherford M.D.   On: 02/19/2020 22:28    Microbiology: Recent Results (from the past 240 hour(s))  Respiratory Panel by RT PCR (Flu A&B, Covid) - Nasopharyngeal Swab     Status: None   Collection Time: 02/19/20 10:59 PM   Specimen: Nasopharyngeal Swab  Result Value Ref Range Status   SARS Coronavirus 2 by RT PCR NEGATIVE NEGATIVE Final    Comment: (NOTE) SARS-CoV-2 target nucleic acids are NOT DETECTED.  The SARS-CoV-2 RNA is generally detectable in upper respiratoy specimens during the acute phase of infection. The lowest concentration of SARS-CoV-2 viral copies this assay can detect is 131 copies/mL. A negative result does not preclude SARS-Cov-2 infection and should not be used as the sole basis for treatment or other patient management decisions. A negative result may occur with  improper specimen collection/handling, submission of specimen other than nasopharyngeal swab, presence of viral mutation(s) within the areas targeted by this assay, and inadequate number of viral copies (<131 copies/mL). A negative result must be combined with clinical observations, patient history, and epidemiological information. The expected result is Negative.  Fact Sheet for Patients:  https://www.moore.com/  Fact Sheet for Healthcare Providers:  https://www.young.biz/  This test is no t yet approved or cleared by the [place] FDA and  has been authorized for detection and/or diagnosis of SARS-CoV-2 by FDA under an Emergency Use Authorization (EUA). This EUA will remain  in effect (meaning this test can be used) for the duration of the COVID-19 declaration under Section 564(b)(1) of the Act, 21 U.S.C. section 360bbb-3(b)(1), unless the authorization is terminated  or revoked sooner.     Influenza A by PCR NEGATIVE NEGATIVE Final   Influenza B by PCR NEGATIVE NEGATIVE Final    Comment: (NOTE) The Xpert Xpress SARS-CoV-2/FLU/RSV assay is intended as an aid in  the diagnosis of influenza from Nasopharyngeal swab specimens and  should not be used as a sole basis for treatment. Nasal washings and  aspirates are unacceptable for Xpert Xpress SARS-CoV-2/FLU/RSV  testing.  Fact Sheet for Patients: https://www.moore.com/  Fact Sheet for Healthcare Providers: https://www.young.biz/  This test is not yet approved or cleared by the [place] FDA and  has been authorized for detection and/or diagnosis of SARS-CoV-2 by  FDA under an Emergency Use Authorization (EUA). This EUA will remain  in effect (meaning this test can be used) for the duration of the  Covid-19 declaration under Section 564(b)(1) of the Act, 21  U.S.C. section 360bbb-3(b)(1), unless the authorization is  terminated or revoked. Performed at [facility], [address] [place], [place] [address]      Labs: Basic Metabolic Panel: Recent Labs  Lab 02/19/20 2259 02/21/20 0320 02/22/20 0277  NA 141 137 135  K 4.3 4.6 4.0  CL 106 105 102  CO2 27 25 26   GLUCOSE 133* 134* 106*  BUN 17 16 18   CREATININE 1.06 0.99 0.92  CALCIUM 9.0 8.6* 8.4*   Liver Function Tests: No results for input(s): AST, ALT, ALKPHOS, BILITOT, PROT, ALBUMIN in the last 168 hours. No results for input(s): LIPASE, AMYLASE in the last 168 hours. No results for input(s): AMMONIA in the last 168 hours. CBC: Recent Labs  Lab 02/19/20 2259 02/21/20 0320 02/22/20 0343 02/23/20 0403  WBC 10.1 10.5 10.7* 10.9*  NEUTROABS 8.7*  --   --   --   HGB 14.2 11.7* 10.8* 11.1*  HCT 43.3 35.5* 33.0* 33.7*  MCV 94.5 95.2 95.7 95.5  PLT 158 143* 131* 144*   Cardiac Enzymes: No results for input(s): CKTOTAL, CKMB, CKMBINDEX, TROPONINI in the last 168  hours. BNP: BNP (last 3 results) No results for input(s): BNP in the last 8760 hours.  ProBNP (last 3 results) No results for input(s): PROBNP in the last 8760 hours.  CBG: No results for input(s): GLUCAP in the last 168 hours.     Signed:  02/24/20, MD [facility] 02/23/2020, 2:33 PM

## 2020-02-23 NOTE — Progress Notes (Signed)
Report called to [place], at [employer] RN

## 2020-02-23 NOTE — NC FL2 (Addendum)
[place] MEDICAID FL2 LEVEL OF CARE SCREENING TOOL     IDENTIFICATION  Patient Name: Spencer [name] Birthdate: 01/16/1946 Sex: male Admission Date (Current Location): 02/19/2020  [facility] and [place] Number:  Producer, television/film/video and Address:  [facility],  [address] [place], [place] [address]      Provider Number: [id number]  Attending Physician Name and Address:  Darlin Drop, DO  Relative Name and Phone Number:     Arty Baumgartner   [phone]        Current Level of Care: Hospital Recommended Level of Care: Skilled Nursing Facility Prior Approval Number:    Date Approved/Denied:   PASRR Number:    [id number]  Discharge Plan: SNF    Current Diagnoses: Patient Active Problem List   Diagnosis Date Noted  . Closed left hip fracture (HCC) 02/19/2020    Orientation RESPIRATION BLADDER Height & Weight     Self, Time, Situation, Place  Normal Continent Weight: 264 lb (119.7 kg) Height:  6\' 3"  (190.5 cm)  BEHAVIORAL SYMPTOMS/MOOD NEUROLOGICAL BOWEL NUTRITION STATUS      Continent  Regular Diet   AMBULATORY STATUS COMMUNICATION OF NEEDS Skin   Extensive Assist Verbally Normal                       Personal Care Assistance Level of Assistance  Bathing, Feeding, Dressing Bathing Assistance: Limited assistance Feeding assistance: Independent Dressing Assistance: Limited assistance     Functional Limitations Info  Sight, Hearing, Speech Sight Info: Impaired ) Hearing Info: Adequate Speech Info: Adequate    SPECIAL CARE FACTORS FREQUENCY  PT (By licensed PT), OT (By licensed OT)     PT Frequency: 5x/week OT Frequency: 5x/week            Contractures Contractures Info: Not present    Additional Factors Info  Code Status, Allergies Code Status Info: Fullcode Allergies Info: Allergies: No Known Allergies           Current Medications (02/23/2020):  This is the current hospital active medication list Current  Facility-Administered Medications  Medication Dose Route Frequency Provider Last Rate Last Admin  . acetaminophen (TYLENOL) tablet 325-650 mg  325-650 mg Oral Q6H PRN 04/24/2020 K, PA-C      . alum & mag hydroxide-simeth (MAALOX/MYLANTA) 200-200-20 MG/5ML suspension 30 mL  30 mL Oral Q4H PRN 12-01-2000 K, PA-C      . bisacodyl (DULCOLAX) suppository 10 mg  10 mg Rectal Daily PRN Janine Ores K, PA-C      . docusate sodium (COLACE) capsule 100 mg  100 mg Oral BID Janine Ores K, PA-C   100 mg at 02/23/20 0935  . enoxaparin (LOVENOX) injection 40 mg  40 mg Subcutaneous Q24H 04/24/20 K, PA-C   40 mg at 02/23/20 0935  . feeding supplement (ENSURE ENLIVE) (ENSURE ENLIVE) liquid 237 mL  237 mL Oral BID BM Hall, Carole N, DO   237 mL at 02/23/20 0934  . ferrous sulfate tablet 325 mg  325 mg Oral TID PC Brown, Blaine K, PA-C   325 mg at 02/23/20 0935  . HYDROcodone-acetaminophen (NORCO) 7.5-325 MG per tablet 1-2 tablet  1-2 tablet Oral Q4H PRN 09-22-1976 K, PA-C   1 tablet at 02/23/20 04/24/20  . HYDROcodone-acetaminophen (NORCO/VICODIN) 5-325 MG per tablet 1-2 tablet  1-2 tablet Oral Q4H PRN 1245 K, PA-C   1 tablet at 02/21/20 1810  . magnesium citrate solution 1 Bottle  1 Bottle Oral Once PRN Janine Ores K, PA-C      . menthol-cetylpyridinium (CEPACOL) lozenge 3 mg  1 lozenge Oral PRN Janine Ores K, PA-C       Or  . phenol (CHLORASEPTIC) mouth spray 1 spray  1 spray Mouth/Throat PRN Janine Ores K, PA-C      . methocarbamol (ROBAXIN) tablet 500 mg  500 mg Oral Q6H PRN Janine Ores K, PA-C   500 mg at 02/23/20 1101   Or  . methocarbamol (ROBAXIN) 500 mg in dextrose 5 % 50 mL IVPB  500 mg Intravenous Q6H PRN Janine Ores K, PA-C      . morphine 2 MG/ML injection 0.5-1 mg  0.5-1 mg Intravenous Q2H PRN Manson Passey, Blaine K, PA-C      . nebivolol (BYSTOLIC) tablet 10 mg  10 mg Oral Daily Janine Ores K, PA-C   10 mg at 02/23/20 0935  . ondansetron (ZOFRAN) tablet 4 mg  4 mg Oral Q6H  PRN Janine Ores K, PA-C       Or  . ondansetron [facility]) injection 4 mg  4 mg Intravenous Q6H PRN Janine Ores K, PA-C      . polyethylene glycol (MIRALAX / GLYCOLAX) packet 17 g  17 g Oral BID Dow Adolph N, DO   17 g at 02/22/20 2043  . senna (SENOKOT) tablet 17.2 mg  2 tablet Oral BID Dow Adolph N, DO   17.2 mg at 02/23/20 2202     Discharge Medications: Please see discharge summary for a list of discharge medications.  Relevant Imaging Results:  Relevant Lab Results:   Additional Information SSN: [id number]  Clearance Coots, LCSW

## 2020-02-23 NOTE — Progress Notes (Signed)
Physical Therapy Treatment Patient Details Name: Spencer [name] MRN: [id number] DOB: 1945-11-08 Today's Date: 02/23/2020    History of Present Illness Patient is 74 y.o. male s/p Lt IM nail after mechanical fall at home. PMH significant for HTN.    PT Comments    Pt performed therex program with assist.  Continued intermittent muscle spasms noted and relaxer requested.   Follow Up Recommendations  SNF     Equipment Recommendations  Rolling walker with 5" wheels    Recommendations for Other Services OT consult     Precautions / Restrictions Precautions Precautions: Fall Restrictions Weight Bearing Restrictions: No Other Position/Activity Restrictions: WBAT    Mobility  Bed Mobility               General bed mobility comments: Pt up in chair and requests back to same  Transfers Overall transfer level: Needs assistance Equipment used: Rolling walker (2 wheeled) Transfers: Sit to/from Stand Sit to Stand: Min assist;Mod assist         General transfer comment: cues for LE management and use of UEs to self assist.  Physical assist to bring wt up and fwd and to balance in initial standing  Ambulation/Gait Ambulation/Gait assistance: Min assist Gait Distance (Feet): 39 Feet Assistive device: Rolling walker (2 wheeled) Gait Pattern/deviations: Step-to pattern;Decreased stride length;Decreased stance time - left;Decreased step length - left;Decreased weight shift to left;Antalgic;Trunk flexed Gait velocity: decr   General Gait Details: cues for posture, position from RW and sequence.     Stairs             Wheelchair Mobility    Modified Rankin (Stroke Patients Only)       Balance Overall balance assessment: Needs assistance Sitting-balance support: No upper extremity supported Sitting balance-Leahy Scale: Good     Standing balance support: During functional activity;Bilateral upper extremity supported Standing balance-Leahy Scale: Poor                               Cognition Arousal/Alertness: Awake/alert Behavior During Therapy: WFL for tasks assessed/performed Overall Cognitive Status: Within Functional Limits for tasks assessed                                        Exercises General Exercises - Lower Extremity Ankle Circles/Pumps: AROM;Both;20 reps;Seated Quad Sets: AROM;Both;10 reps;Supine Heel Slides: AAROM;Left;15 reps;Supine Hip ABduction/ADduction: AAROM;Left;15 reps;Supine    General Comments        Pertinent Vitals/Pain Pain Assessment: 0-10 Pain Score: 5  Pain Location: Lt hip Pain Descriptors / Indicators: Aching;Grimacing;Discomfort;Spasm Pain Intervention(s): Limited activity within patient's tolerance;Monitored during session;Premedicated before session;Ice applied    Home Living                      Prior Function            PT Goals (current goals can now be found in the care plan section) Acute Rehab PT Goals Patient Stated Goal: get back to being independent PT Goal Formulation: With patient Time For Goal Achievement: 02/28/20 Potential to Achieve Goals: Good Progress towards PT goals: Progressing toward goals    Frequency    Min 4X/week      PT Plan Current plan remains appropriate    Co-evaluation              AM-PAC PT "6  Clicks" Mobility   Outcome Measure  Help needed turning from your back to your side while in a flat bed without using bedrails?: A Little Help needed moving from lying on your back to sitting on the side of a flat bed without using bedrails?: A Lot Help needed moving to and from a bed to a chair (including a wheelchair)?: A Lot Help needed standing up from a chair using your arms (e.g., wheelchair or bedside chair)?: A Lot Help needed to walk in hospital room?: A Little Help needed climbing 3-5 steps with a railing? : A Lot 6 Click Score: 14    End of Session Equipment Utilized During Treatment: Gait  belt Activity Tolerance: Patient limited by fatigue;Patient limited by pain;Patient tolerated treatment well Patient left: in chair;with call bell/phone within reach;with chair alarm set;with nursing/sitter in room Nurse Communication: Mobility status PT Visit Diagnosis: Muscle weakness (generalized) (M62.81);Difficulty in walking, not elsewhere classified (R26.2);History of falling (Z91.81);Unsteadiness on feet (R26.81)     Time: 7322-0254 PT Time Calculation (min) (ACUTE ONLY): 20 min  Charges:  $Gait Training: 8-22 mins $Therapeutic Exercise: 8-22 mins                     Mauro Kaufmann PT Acute Rehabilitation Services Pager [phone] Office [phone]    Kacie Huxtable 02/23/2020, 2:19 PM

## 2020-02-23 NOTE — Plan of Care (Signed)

## 2020-02-23 NOTE — Progress Notes (Signed)
Inpatient Rehab Admissions Coordinator:   I will not have a bed on CIR for this pt to admit through the weekend.  Discussed with Dr. Margo Aye and Vivi Barrack, LCSW; pt agreeable to proceed with SNF placement.  CIR will sign off at this time.   Estill Dooms, PT, DPT Admissions Coordinator [phone] 02/23/20  1:03 PM

## 2020-02-23 NOTE — TOC Transition Note (Addendum)
Transition of Care [facility]) - CM/SW Discharge Note   Patient Details  Name: Spencer [name] MRN: [id number] Date of Birth: [birth year]  [facility]) CM/SW Contact:  Clearance Coots, LCSW Phone Number: 02/23/2020, 2:23 PM   Clinical Narrative:    Spencer [name] will accept patient today, under BCBS auth waiver.  Nurse call report to: [phone] Room:102 Patient sister Spencer [name] to transport today.   Final next level of care: Skilled Nursing Facility Barriers to Discharge: No Barriers Identified   Patient Goals and CMS Choice   CMS Medicare.gov Compare Post Acute Care list provided to:: Patient Choice offered to / list presented to : Patient  Discharge Placement PASRR number recieved: 02/23/20            Patient chooses bed at: [facility] Patient to be transferred to facility by: Spencer [name] will transport Name of family member notified: Sister Spencer [name] Patient and family notified of of transfer: 02/23/20  Discharge Plan and Services In-house Referral: Clinical Social Work Discharge Planning Services: [employer] Consult Post Acute Care Choice: Skilled Nursing Facility          DME Arranged: N/A DME Agency: NA       HH Arranged: NA HH Agency: NA        Social Determinants of Health (SDOH) Interventions     Readmission Risk Interventions No flowsheet data found.

## 2020-02-23 NOTE — Plan of Care (Signed)

## 2020-02-27 ENCOUNTER — Non-Acute Institutional Stay (SKILLED_NURSING_FACILITY): Payer: BC Managed Care – PPO | Admitting: Internal Medicine

## 2020-02-27 ENCOUNTER — Encounter: Payer: Self-pay | Admitting: Internal Medicine

## 2020-02-27 DIAGNOSIS — E7800 Pure hypercholesterolemia, unspecified: Secondary | ICD-10-CM

## 2020-02-27 DIAGNOSIS — R739 Hyperglycemia, unspecified: Secondary | ICD-10-CM | POA: Diagnosis not present

## 2020-02-27 DIAGNOSIS — Z789 Other specified health status: Secondary | ICD-10-CM

## 2020-02-27 DIAGNOSIS — Z299 Encounter for prophylactic measures, unspecified: Secondary | ICD-10-CM

## 2020-02-27 DIAGNOSIS — I10 Essential (primary) hypertension: Secondary | ICD-10-CM | POA: Diagnosis not present

## 2020-02-27 DIAGNOSIS — S72002A Fracture of unspecified part of neck of left femur, initial encounter for closed fracture: Secondary | ICD-10-CM

## 2020-02-27 DIAGNOSIS — D62 Acute posthemorrhagic anemia: Secondary | ICD-10-CM

## 2020-02-27 DIAGNOSIS — Z79899 Other long term (current) drug therapy: Secondary | ICD-10-CM

## 2020-02-27 NOTE — Progress Notes (Signed)
Provider:  Gwenith Spitz. Renato Gails, D.O., C.M.D. [facility] Room Number: 102 P Place of Service:  SNF (31)  PCP: [facility] Patient Care Team: [facility] as PCP - General  Extended Emergency Contact Information Primary Emergency Contact: Lawernce Keas Mobile Phone: [phone] Relation: Sister Preferred language: English Interpreter needed? No  Code Status: FULL CODE Goals of Care: Advanced Directive information Advanced Directives 02/27/2020  Does Patient Have a Medical Advance Directive? No  Would patient like information on creating a medical advance directive? No - Patient declined   Chief Complaint  Patient presents with  . New Admit To SNF    Rehab admission s/p hip fracture    HPI: Patient is a 74 y.o. Spencer [name] seen today for admission to [facility] status post hospitalization from September 27 through October 1 status post fall. He lives with his sister and he was loading his wheelbarrow through bag into the wheelbarrow and overturned pulling him with it. In the process he landed on his left side and had immediate pain. He did bump his head but did not lose consciousness and has a good recollection of the entire event. He was unable to walk since that time. EMS was called and he went to [facility] ER. There he was found to have an intertrochanteric fracture. Orthopedics was consulted and he was transferred to [facility]. Otherwise he has a past medical history significant only for hypertension; however, it appears he is also had hyperlipidemia for which he takes Crestor.   His glucose level was 133 on September 27 in the emergency department. His baseline hemoglobin was 14.2. His CT of the head showed no acute intracranial abnormality scalp swelling or calvarial fracture. He did have mild parenchymal volume loss and chronic microvascular angiopathy changes. His chest x-ray showed some linear  atelectasis at the bases and shallow inspiration. The hip and pelvis x-rays revealed a displaced intertrochanteric left femur fracture.  He underwent intramedullary nail placement on September 28 with Dr. Dion Saucier. On October 1 he was doing well with well-controlled pain he had had a bowel movement and was awaiting his transfer to rehab. He came to [facility] weightbearing as tolerated to his left lower extremity, up with PT. He did not walk with an assistive device prior to his admission. He remains on Lovenox DVT prophylaxis for 30 days. Is to follow-up with Dr. Dion Saucier in 2 weeks. He does live with his sister and can return home with her when safe and able.  When seen, he was doing well and expressed that he has minimal discomfort only when up with therapy. He is using a rolling walker whiskeys at this time. He had postop anemia and is on iron at this time.  Past Medical History:  Diagnosis Date  . Hypertension    Past Surgical History:  Procedure Laterality Date  . INTRAMEDULLARY (IM) NAIL INTERTROCHANTERIC Left 02/20/2020   Procedure: INTRAMEDULLARY (IM) NAIL INTERTROCHANTRIC;  Surgeon: Teryl Lucy, MD;  Location: [facility]S;  Service: Orthopedics;  Laterality: Left;    Social History   Socioeconomic History  . Marital status: Single    Spouse name: Not on file  . Number of children: Not on file  . Years of education: Not on file  . Highest education level: Not on file  Occupational History  . Not on file  Tobacco Use  . Smoking status: Never Smoker  . Smokeless tobacco: Never Used  Substance  and Sexual Activity  . Alcohol use: Never  . Drug use: Never  . Sexual activity: Not on file  Other Topics Concern  . Not on file  Social History Narrative  . Not on file   Social Determinants of Health   Financial Resource Strain:   . Difficulty of Paying Living Expenses: Not on file  Food Insecurity:   . Worried About Programme researcher, broadcasting/film/video in the Last Year: Not on file  . Ran Out of  Food in the Last Year: Not on file  Transportation Needs:   . Lack of Transportation (Medical): Not on file  . Lack of Transportation (Non-Medical): Not on file  Physical Activity:   . Days of Exercise per Week: Not on file  . Minutes of Exercise per Session: Not on file  Stress:   . Feeling of Stress : Not on file  Social Connections:   . Frequency of Communication with Friends and Family: Not on file  . Frequency of Social Gatherings with Friends and Family: Not on file  . Attends Religious Services: Not on file  . Active Member of Clubs or Organizations: Not on file  . Attends Banker Meetings: Not on file  . Marital Status: Not on file    reports that he has never smoked. He has never used smokeless tobacco. He reports that he does not drink alcohol and does not use drugs.  Functional Status Survey:    History reviewed. No pertinent family history.  Health Maintenance  Topic Date Due  . Hepatitis C Screening  Never done  . TETANUS/TDAP  Never done  . COLONOSCOPY  Never done  . PNA vac Low Risk Adult (1 of 2 - PCV13) Never done  . INFLUENZA VACCINE  Never done  . COVID-19 Vaccine  Completed    No Known Allergies  Outpatient Encounter Medications as of 02/27/2020  Medication Sig  . bisacodyl (DULCOLAX) 10 MG suppository Place 10 mg rectally as needed for moderate constipation.  . enoxaparin (LOVENOX) 40 MG/0.4ML injection Inject 0.4 mLs (40 mg total) into the skin daily.  . feeding supplement, ENSURE ENLIVE, (ENSURE ENLIVE) LIQD Take 237 mLs by mouth 2 (two) times daily between meals for 7 days.  . ferrous sulfate 325 (65 FE) MG tablet Take 1 tablet (325 mg total) by mouth 2 (two) times daily with a meal.  . magnesium hydroxide (MILK OF MAGNESIA) 400 MG/5ML suspension Take by mouth daily as needed for mild constipation.  . methocarbamol (ROBAXIN) 500 MG tablet Take 1 tablet (500 mg total) by mouth every 6 (six) hours as needed for muscle spasms.  . nebivolol  (BYSTOLIC) 10 MG tablet Take 10 mg by mouth daily.  . polyethylene glycol (MIRALAX / GLYCOLAX) 17 g packet Take 17 g by mouth daily.  . rosuvastatin (CRESTOR) 40 MG tablet Take 40 mg by mouth at bedtime.  . Vitamin D, Ergocalciferol, (DRISDOL) 1.25 MG (50000 UNIT) CAPS capsule Take 50,000 Units by mouth once a week.   No facility-administered encounter medications on file as of 02/27/2020.    Review of Systems  Constitutional: Negative for chills, fever and malaise/fatigue.  HENT: Negative for congestion, hearing loss and sore throat.   Eyes: Negative for blurred vision.       Glasses  Respiratory: Negative for cough and shortness of breath.   Cardiovascular: Negative for chest pain, palpitations and leg swelling.  Gastrointestinal: Negative for abdominal pain, blood in stool, constipation and melena.  Genitourinary: Negative for dysuria.  Musculoskeletal: Positive for falls and joint pain. Negative for myalgias.  Skin: Negative for itching and rash.  Neurological: Negative for dizziness and loss of consciousness.  Endo/Heme/Allergies: Does not bruise/bleed easily.  Psychiatric/Behavioral: Negative for depression and memory loss. The patient is not nervous/anxious and does not have insomnia.     Vitals:   02/27/20 0943  BP: 126/69  Pulse: 65  Temp: (!) 97.5 F (36.4 C)  Weight: 264 lb (119.7 kg)  Height: 6\' 3"  (1.905 m)   Body mass index is 33 kg/m. Physical Exam Vitals reviewed.  Constitutional:      General: He is not in acute distress.    Appearance: Normal appearance. He is not toxic-appearing.  HENT:     Head: Normocephalic and atraumatic.     Right Ear: External ear normal.     Left Ear: External ear normal.     Nose: Nose normal.     Mouth/Throat:     Pharynx: Oropharynx is clear.  Eyes:     Extraocular Movements: Extraocular movements intact.     Conjunctiva/sclera: Conjunctivae normal.     Pupils: Pupils are equal, round, and reactive to light.     Comments:  glasses  Cardiovascular:     Rate and Rhythm: Normal rate and regular rhythm.     Pulses: Normal pulses.     Heart sounds: Normal heart sounds.  Pulmonary:     Effort: Pulmonary effort is normal.     Breath sounds: Normal breath sounds. No wheezing, rhonchi or rales.  Abdominal:     General: Bowel sounds are normal.     Tenderness: There is no abdominal tenderness.  Musculoskeletal:     Cervical back: Neck supple.     Right lower leg: No edema.     Left lower leg: No edema.     Comments: He was able to stand up with his walker from the recliner chair for me to examine the hip  Lymphadenopathy:     Cervical: No cervical adenopathy.  Skin:    General: Skin is warm and dry.     Comments: Dressings intact over hip incision--minimal drainage now and no surrounding erythema, warmth or swelling  Neurological:     General: No focal deficit present.     Mental Status: He is alert and oriented to person, place, and time. Mental status is at baseline.     Cranial Nerves: No cranial nerve deficit.     Sensory: No sensory deficit.     Motor: No weakness.     Coordination: Coordination normal.     Gait: Gait abnormal.     Deep Tendon Reflexes: Reflexes normal.  Psychiatric:        Mood and Affect: Mood normal.        Behavior: Behavior normal.        Thought Content: Thought content normal.        Judgment: Judgment normal.     Labs reviewed: Basic Metabolic Panel: Recent Labs    02/19/20 2259 02/21/20 0320 02/22/20 0343  NA 141 137 135  K 4.3 4.6 4.0  CL 106 105 102  CO2 27 25 26   GLUCOSE 133* 134* 106*  BUN 17 16 18   CREATININE 1.06 0.99 0.92  CALCIUM 9.0 8.6* 8.4*   Liver Function Tests: No results for input(s): AST, ALT, ALKPHOS, BILITOT, PROT, ALBUMIN in the last 8760 hours. No results for input(s): LIPASE, AMYLASE in the last 8760 hours. No results for input(s): AMMONIA in the last 8760  hours. CBC: Recent Labs    02/19/20 2259 02/19/20 2259 02/21/20 0320  02/22/20 0343 02/23/20 0403  WBC 10.1   < > 10.5 10.7* 10.9*  NEUTROABS 8.7*  --   --   --   --   HGB 14.2   < > 11.7* 10.8* 11.1*  HCT 43.3   < > 35.5* 33.0* 33.7*  MCV 94.5   < > 95.2 95.7 95.5  PLT 158   < > 143* 131* 144*   < > = values in this interval not displayed.   Cardiac Enzymes: No results for input(s): CKTOTAL, CKMB, CKMBINDEX, TROPONINI in the last 8760 hours. BNP: Invalid input(s): POCBNP No results found for: HGBA1C No results found for: TSH No results found for: VITAMINB12 No results found for: FOLATE No results found for: IRON, TIBC, FERRITIN  Imaging and Procedures obtained prior to SNF admission: CT Head Wo Contrast  Result Date: 02/19/2020 CLINICAL DATA:  Fall while unloading wheelbarrow EXAM: CT HEAD WITHOUT CONTRAST TECHNIQUE: Contiguous axial images were obtained from the base of the skull through the vertex without intravenous contrast. COMPARISON:  None. FINDINGS: Brain: No evidence of acute infarction, hemorrhage, hydrocephalus, extra-axial collection, visible mass lesion or mass effect. Symmetric prominence of the ventricles, cisterns and sulci compatible with parenchymal volume loss. Patchy areas of white matter hypoattenuation are most compatible with chronic microvascular angiopathy. Vascular: Atherosclerotic calcification of the carotid siphons. No hyperdense vessel. Skull: No calvarial fracture or suspicious osseous lesion. No scalp swelling or hematoma. Sinuses/Orbits: Paranasal sinuses and mastoid air cells are predominantly clear. Included orbital structures are unremarkable. Other: None. IMPRESSION: 1. No acute intracranial abnormality. No scalp swelling or calvarial fracture. 2. Mild parenchymal volume loss and chronic microvascular angiopathy changes. Electronically Signed   By: Kreg Shropshire M.D.   On: 02/19/2020 22:26   Pelvis Portable  Result Date: 02/20/2020 CLINICAL DATA:  ORIF left intertrochanteric fracture. EXAM: PORTABLE PELVIS 1-2 VIEWS  COMPARISON:  Concurrent hip exam reported separately. FINDINGS: Intramedullary nail with trans trochanteric screw partially included in the field of view fixating intertrochanteric femur fracture. Recent postsurgical change includes air in the adjacent soft tissues. No other fracture of the pelvis or acute findings. IMPRESSION: ORIF left intertrochanteric femur fracture. No immediate postoperative complication. Electronically Signed   By: Narda Rutherford M.D.   On: 02/20/2020 16:49   DG Chest Port 1 View  Result Date: 02/19/2020 CLINICAL DATA:  Hip fracture after a fall EXAM: PORTABLE CHEST 1 VIEW COMPARISON:  None. FINDINGS: Shallow inspiration. Heart size and pulmonary vascularity are normal for technique. Vascular crowding or linear atelectasis in the lung bases. No consolidation or edema. No pleural effusions. No pneumothorax. Mediastinal contours appear intact. IMPRESSION: Shallow inspiration with vascular crowding or linear atelectasis in the lung bases. Electronically Signed   By: Burman Nieves M.D.   On: 02/19/2020 23:02   DG C-Arm 1-60 Min-No Report  Result Date: 02/20/2020 CLINICAL DATA:  Left intramedullary nail. EXAM: OPERATIVE LEFT HIP (WITH PELVIS IF PERFORMED) 2 VIEWS TECHNIQUE: Fluoroscopic spot image(s) were submitted for interpretation post-operatively. COMPARISON:  Preoperative radiograph. FINDINGS: Four fluoroscopic spot images obtained in the operating room. Intramedullary nail with trans trochanteric and distal locking screw traverse intertrochanteric femur fracture. Total fluoroscopy time 1 minutes 12 seconds. Total dose 31.545 mGy. IMPRESSION: Fluoroscopic spot views after ORIF left intertrochanteric fracture. Electronically Signed   By: Narda Rutherford M.D.   On: 02/20/2020 16:48   DG Hip Port Unilat With Pelvis 1V Left  Result Date: 02/20/2020 CLINICAL  DATA:  Postop hip surgery today. EXAM: DG HIP (WITH OR WITHOUT PELVIS) 1V PORT LEFT COMPARISON:  Radiograph yesterday.  FINDINGS: Intramedullary rod with trans trochanteric and distal locking screw fixation of intertrochanteric proximal femur fracture. The fracture is in improved alignment compared to preoperative imaging. No periprosthetic lucency. Recent postsurgical change includes air and edema in the soft tissues. IMPRESSION: Intramedullary rod and screw fixation of intertrochanteric proximal femur fracture, in improved alignment compared to preoperative imaging. No immediate postoperative complication Electronically Signed   By: Narda RutherfordMelanie  Sanford M.D.   On: 02/20/2020 16:45   DG HIP OPERATIVE UNILAT W OR W/O PELVIS LEFT  Result Date: 02/20/2020 CLINICAL DATA:  Left intramedullary nail. EXAM: OPERATIVE LEFT HIP (WITH PELVIS IF PERFORMED) 2 VIEWS TECHNIQUE: Fluoroscopic spot image(s) were submitted for interpretation post-operatively. COMPARISON:  Preoperative radiograph. FINDINGS: Four fluoroscopic spot images obtained in the operating room. Intramedullary nail with trans trochanteric and distal locking screw traverse intertrochanteric femur fracture. Total fluoroscopy time 1 minutes 12 seconds. Total dose 31.545 mGy. IMPRESSION: Fluoroscopic spot views after ORIF left intertrochanteric fracture. Electronically Signed   By: Narda RutherfordMelanie  Sanford M.D.   On: 02/20/2020 16:48   DG Hip Unilat With Pelvis 2-3 Views Left  Result Date: 02/19/2020 CLINICAL DATA:  Post fall with left hip pain. EXAM: DG HIP (WITH OR WITHOUT PELVIS) 2-3V LEFT COMPARISON:  None. FINDINGS: Displaced intertrochanteric left femur fracture. Fracture involves the greater trochanter with mild combination. There is proximal migration of the femoral shaft. Femoral head remains seated. Pubic rami are intact. No additional fracture of the pelvis. IMPRESSION: Displaced intertrochanteric left femur fracture. Electronically Signed   By: Narda RutherfordMelanie  Sanford M.D.   On: 02/19/2020 22:28    Assessment/Plan 1. Closed fracture of left hip, initial encounter (HCC) -s/p IM  nail with Dr. Dion SaucierLandau, f/u as scheduled -cont PT, OT with goal to return home with his sister -previously walked unassisted and active  2. Benign essential hypertension -bp at goal with current regimen, cont same and monitor  3. Pure hypercholesterolemia -on crestor therapy, f/u outpatient with [facility]  4. Hyperglycemia -noted during ED visit, but likely was nonfasting, may need f/u hba1c outpatient with obesity and hyperglycemia  5. Postoperative anemia due to acute blood loss -d/c hgb was 11.1 down from 14 baseline, cont iron and f/u cbc, bmp at one week  6. DVT prophylaxis -cont 30 day course of lovenox per ortho  7. Full code status - Full code  Family/ staff Communication: d/w snf nurse  Labs/tests ordered: cbc, bmp at 1 wk  Caralee Morea L. Iyonna Rish, D.O. Geriatrics [facility] [address] [place], [place] [id number] Cell Phone (Mon-Fri 8am-5pm):  [phone] On Call:  [phone] & follow prompts after 5pm & weekends Office Phone:  [phone] Office Fax:  [phone]

## 2020-02-27 NOTE — Progress Notes (Signed)
This encounter was created in error - please disregard.

## 2020-03-01 DIAGNOSIS — I10 Essential (primary) hypertension: Secondary | ICD-10-CM | POA: Insufficient documentation

## 2020-03-01 DIAGNOSIS — D62 Acute posthemorrhagic anemia: Secondary | ICD-10-CM | POA: Insufficient documentation

## 2020-03-01 DIAGNOSIS — R739 Hyperglycemia, unspecified: Secondary | ICD-10-CM | POA: Insufficient documentation

## 2020-03-01 DIAGNOSIS — E7800 Pure hypercholesterolemia, unspecified: Secondary | ICD-10-CM | POA: Insufficient documentation

## 2020-03-11 ENCOUNTER — Non-Acute Institutional Stay (SKILLED_NURSING_FACILITY): Payer: BC Managed Care – PPO | Admitting: Family

## 2020-03-11 ENCOUNTER — Encounter: Payer: Self-pay | Admitting: Family

## 2020-03-11 DIAGNOSIS — R600 Localized edema: Secondary | ICD-10-CM

## 2020-03-11 DIAGNOSIS — S72002A Fracture of unspecified part of neck of left femur, initial encounter for closed fracture: Secondary | ICD-10-CM

## 2020-03-11 NOTE — Progress Notes (Signed)
Location:    [facility] Room Number: 102/P Place of Service:  SNF (31) Provider:  Richarda Blade NP  [facility]  Patient Care Team: [facility] as PCP - General  Extended Emergency Contact Information Primary Emergency Contact: Lawernce Keas Mobile Phone: [phone] Relation: Sister Preferred language: English Interpreter needed? No  Code Status:  Full Code Goals of care: Advanced Directive information Advanced Directives 03/11/2020  Does Patient Have a Medical Advance Directive? Yes  Would patient like information on creating a medical advance directive? -     Chief Complaint  Patient presents with   Acute Visit    Swelling on the Leg    HPI:  Pt is a 74 y.o. male seen today for an acute visit for    Past Medical History:  Diagnosis Date   Hypertension    Past Surgical History:  Procedure Laterality Date   INTRAMEDULLARY (IM) NAIL INTERTROCHANTERIC Left 02/20/2020   Procedure: INTRAMEDULLARY (IM) NAIL INTERTROCHANTRIC;  Surgeon: Teryl Lucy, MD;  Location: [facility];  Service: Orthopedics;  Laterality: Left;    No Known Allergies  Allergies as of 03/11/2020   No Known Allergies     Medication List       Accurate as of March 11, 2020  3:45 PM. If you have any questions, ask your nurse or doctor.        STOP taking these medications   Dulcolax 10 MG suppository Generic drug: bisacodyl Stopped by: Donalee Citrin Ngetich, NP   methocarbamol 500 MG tablet Commonly known as: ROBAXIN Stopped by: Caesar Bookman, NP     TAKE these medications   enoxaparin 40 MG/0.4ML injection Commonly known as: Lovenox Inject 0.4 mLs (40 mg total) into the skin daily.   ferrous sulfate 325 (65 FE) MG tablet Take 1 tablet (325 mg total) by mouth 2 (two) times daily with a meal.   magnesium hydroxide 400 MG/5ML suspension Commonly known as: MILK OF MAGNESIA If no BM in 3 days, give 30 cc Milk of Magnesium p.o.  x 1 dose in 24 hours as needed (Do not use standing constipation orders for residents with renal failure CFR less than 30. Contact MD for orders) (Physician Order)   nebivolol 10 MG tablet Commonly known as: BYSTOLIC Take 10 mg by mouth daily.   polyethylene glycol 17 g packet Commonly known as: MIRALAX / GLYCOLAX Take 17 g by mouth daily.   rosuvastatin 40 MG tablet Commonly known as: CRESTOR Take 40 mg by mouth at bedtime.   Vitamin D (Ergocalciferol) 1.25 MG (50000 UNIT) Caps capsule Commonly known as: DRISDOL Take 50,000 Units by mouth once a week.       Review of Systems  Immunization History  Administered Date(s) Administered   Unspecified SARS-COV-2 Vaccination 07/29/2019, 08/19/2019   Pertinent  Health Maintenance Due  Topic Date Due   COLONOSCOPY  Never done   PNA vac Low Risk Adult (1 of 2 - PCV13) Never done   INFLUENZA VACCINE  Never done   No flowsheet data found. Functional Status Survey:    Vitals:   03/11/20 1543  BP: 124/69  Pulse: 81  Resp: 18  Temp: 97.7 F (36.5 C)  SpO2: 94%  Weight: 252 lb 12.8 oz (114.7 kg)  Height: 6\' 3"  (1.905 m)   Body mass index is 31.6 kg/m. Physical Exam  Labs reviewed: Recent Labs    02/19/20 2259 02/21/20 0320 02/22/20 0343  NA 141 137 135  K  4.3 4.6 4.0  CL 106 105 102  CO2 27 25 26   GLUCOSE 133* 134* 106*  BUN 17 16 18   CREATININE 1.06 0.99 0.92  CALCIUM 9.0 8.6* 8.4*   No results for input(s): AST, ALT, ALKPHOS, BILITOT, PROT, ALBUMIN in the last 8760 hours. Recent Labs    02/19/20 2259 02/19/20 2259 02/21/20 0320 02/22/20 0343 02/23/20 0403  WBC 10.1   < > 10.5 10.7* 10.9*  NEUTROABS 8.7*  --   --   --   --   HGB 14.2   < > 11.7* 10.8* 11.1*  HCT 43.3   < > 35.5* 33.0* 33.7*  MCV 94.5   < > 95.2 95.7 95.5  PLT 158   < > 143* 131* 144*   < > = values in this interval not displayed.   No results found for: TSH No results found for: HGBA1C No results found for: CHOL, HDL, LDLCALC,  LDLDIRECT, TRIG, CHOLHDL  Significant Diagnostic Results in last 30 days:  CT Head Wo Contrast  Result Date: 02/19/2020 CLINICAL DATA:  Fall while unloading wheelbarrow EXAM: CT HEAD WITHOUT CONTRAST TECHNIQUE: Contiguous axial images were obtained from the base of the skull through the vertex without intravenous contrast. COMPARISON:  None. FINDINGS: Brain: No evidence of acute infarction, hemorrhage, hydrocephalus, extra-axial collection, visible mass lesion or mass effect. Symmetric prominence of the ventricles, cisterns and sulci compatible with parenchymal volume loss. Patchy areas of white matter hypoattenuation are most compatible with chronic microvascular angiopathy. Vascular: Atherosclerotic calcification of the carotid siphons. No hyperdense vessel. Skull: No calvarial fracture or suspicious osseous lesion. No scalp swelling or hematoma. Sinuses/Orbits: Paranasal sinuses and mastoid air cells are predominantly clear. Included orbital structures are unremarkable. Other: None. IMPRESSION: 1. No acute intracranial abnormality. No scalp swelling or calvarial fracture. 2. Mild parenchymal volume loss and chronic microvascular angiopathy changes. Electronically Signed   By: 04/24/20 M.D.   On: 02/19/2020 22:26   Pelvis Portable  Result Date: 02/20/2020 CLINICAL DATA:  ORIF left intertrochanteric fracture. EXAM: PORTABLE PELVIS 1-2 VIEWS COMPARISON:  Concurrent hip exam reported separately. FINDINGS: Intramedullary nail with trans trochanteric screw partially included in the field of view fixating intertrochanteric femur fracture. Recent postsurgical change includes air in the adjacent soft tissues. No other fracture of the pelvis or acute findings. IMPRESSION: ORIF left intertrochanteric femur fracture. No immediate postoperative complication. Electronically Signed   By: 02/21/2020 M.D.   On: 02/20/2020 16:49   DG Chest Port 1 View  Result Date: 02/19/2020 CLINICAL DATA:  Hip fracture  after a fall EXAM: PORTABLE CHEST 1 VIEW COMPARISON:  None. FINDINGS: Shallow inspiration. Heart size and pulmonary vascularity are normal for technique. Vascular crowding or linear atelectasis in the lung bases. No consolidation or edema. No pleural effusions. No pneumothorax. Mediastinal contours appear intact. IMPRESSION: Shallow inspiration with vascular crowding or linear atelectasis in the lung bases. Electronically Signed   By: 02/22/2020 M.D.   On: 02/19/2020 23:02   DG C-Arm 1-60 Min-No Report  Result Date: 02/20/2020 CLINICAL DATA:  Left intramedullary nail. EXAM: OPERATIVE LEFT HIP (WITH PELVIS IF PERFORMED) 2 VIEWS TECHNIQUE: Fluoroscopic spot image(s) were submitted for interpretation post-operatively. COMPARISON:  Preoperative radiograph. FINDINGS: Four fluoroscopic spot images obtained in the operating room. Intramedullary nail with trans trochanteric and distal locking screw traverse intertrochanteric femur fracture. Total fluoroscopy time 1 minutes 12 seconds. Total dose 31.545 mGy. IMPRESSION: Fluoroscopic spot views after ORIF left intertrochanteric fracture. Electronically Signed   By: 02/21/2020  [place] M.D.   On: 02/20/2020 16:48   DG Hip Port Unilat With Pelvis 1V Left  Result Date: 02/20/2020 CLINICAL DATA:  Postop hip surgery today. EXAM: DG HIP (WITH OR WITHOUT PELVIS) 1V PORT LEFT COMPARISON:  Radiograph yesterday. FINDINGS: Intramedullary rod with trans trochanteric and distal locking screw fixation of intertrochanteric proximal femur fracture. The fracture is in improved alignment compared to preoperative imaging. No periprosthetic lucency. Recent postsurgical change includes air and edema in the soft tissues. IMPRESSION: Intramedullary rod and screw fixation of intertrochanteric proximal femur fracture, in improved alignment compared to preoperative imaging. No immediate postoperative complication Electronically Signed   By: Narda Rutherford M.D.   On: 02/20/2020 16:45    DG HIP OPERATIVE UNILAT W OR W/O PELVIS LEFT  Result Date: 02/20/2020 CLINICAL DATA:  Left intramedullary nail. EXAM: OPERATIVE LEFT HIP (WITH PELVIS IF PERFORMED) 2 VIEWS TECHNIQUE: Fluoroscopic spot image(s) were submitted for interpretation post-operatively. COMPARISON:  Preoperative radiograph. FINDINGS: Four fluoroscopic spot images obtained in the operating room. Intramedullary nail with trans trochanteric and distal locking screw traverse intertrochanteric femur fracture. Total fluoroscopy time 1 minutes 12 seconds. Total dose 31.545 mGy. IMPRESSION: Fluoroscopic spot views after ORIF left intertrochanteric fracture. Electronically Signed   By: Narda Rutherford M.D.   On: 02/20/2020 16:48   DG Hip Unilat With Pelvis 2-3 Views Left  Result Date: 02/19/2020 CLINICAL DATA:  Post fall with left hip pain. EXAM: DG HIP (WITH OR WITHOUT PELVIS) 2-3V LEFT COMPARISON:  None. FINDINGS: Displaced intertrochanteric left femur fracture. Fracture involves the greater trochanter with mild combination. There is proximal migration of the femoral shaft. Femoral head remains seated. Pubic rami are intact. No additional fracture of the pelvis. IMPRESSION: Displaced intertrochanteric left femur fracture. Electronically Signed   By: Narda Rutherford M.D.   On: 02/19/2020 22:28    Assessment/Plan There are no diagnoses linked to this encounter.   Family/ staff Communication:   Labs/tests ordered:

## 2020-03-11 NOTE — Progress Notes (Signed)
Location:  [facility] Room Number: 102/P Place of Service:  SNF (31) Provider: Richarda Blade FNP-C  [facility]  Patient Care Team: [facility] as PCP - General  Extended Emergency Contact Information Primary Emergency Contact: Lawernce Keas Mobile Phone: [phone] Relation: Sister Preferred language: English Interpreter needed? No  Code Status:  Full Code  Goals of care: Advanced Directive information Advanced Directives 03/11/2020  Does Patient Have a Medical Advance Directive? Yes  Would patient like information on creating a medical advance directive? -     Chief Complaint  Patient presents with  . Acute Visit    Swelling on the Leg    HPI:  Pt is a 74 y.o. male seen today for an acute visit for evaluation of bilateral lower extremities edema.He is seen in his room today sitting up on wheelchair.Facility Nurse reports patient's has worsening lower extremities today.He tends to sit most of the time on the wheelchair.He states does not like to sit on recliner due to difficulty getting in and out of the recliner. He denies any cough or wheezing.He states gets a little short winded when walking sometimes.He states several years ago he had his legs wrapped and was taking a fluid pill but does not  Know why he stopped.He denies any history of congestive heart failure. He denies any pain on the legs.He is status post hospital admission from 02/19/2020 - 02/23/2020 post fall with displaced intertrochanteric left femur fracture underwent intramedullary nail placement on 02/20/2020 with Dr. Dion Saucier. He continues to work with therapy here in rehab.    Past Medical History:  Diagnosis Date  . Hypertension    Past Surgical History:  Procedure Laterality Date  . INTRAMEDULLARY (IM) NAIL INTERTROCHANTERIC Left 02/20/2020   Procedure: INTRAMEDULLARY (IM) NAIL INTERTROCHANTRIC;  Surgeon: Teryl Lucy, MD;  Location: [facility];   Service: Orthopedics;  Laterality: Left;    No Known Allergies  Outpatient Encounter Medications as of 03/11/2020  Medication Sig  . enoxaparin (LOVENOX) 40 MG/0.4ML injection Inject 0.4 mLs (40 mg total) into the skin daily.  . ferrous sulfate 325 (65 FE) MG tablet Take 1 tablet (325 mg total) by mouth 2 (two) times daily with a meal.  . magnesium hydroxide (MILK OF MAGNESIA) 400 MG/5ML suspension If no BM in 3 days, give 30 cc Milk of Magnesium p.o. x 1 dose in 24 hours as needed (Do not use standing constipation orders for residents with renal failure CFR less than 30. Contact MD for orders) (Physician Order)  . nebivolol (BYSTOLIC) 10 MG tablet Take 10 mg by mouth daily.  . polyethylene glycol (MIRALAX / GLYCOLAX) 17 g packet Take 17 g by mouth daily.  . rosuvastatin (CRESTOR) 40 MG tablet Take 40 mg by mouth at bedtime.  . Vitamin D, Ergocalciferol, (DRISDOL) 1.25 MG (50000 UNIT) CAPS capsule Take 50,000 Units by mouth once a week.  . [DISCONTINUED] bisacodyl (DULCOLAX) 10 MG suppository Place 10 mg rectally as needed for moderate constipation.  . [DISCONTINUED] methocarbamol (ROBAXIN) 500 MG tablet Take 1 tablet (500 mg total) by mouth every 6 (six) hours as needed for muscle spasms.   No facility-administered encounter medications on file as of 03/11/2020.    Review of Systems  Constitutional: Negative for appetite change, chills and fatigue.  Respiratory: Negative for cough, chest tightness, shortness of breath and wheezing.   Cardiovascular: Positive for leg swelling. Negative for chest pain and palpitations.  Gastrointestinal: Negative for abdominal distention, abdominal pain, constipation,  diarrhea, nausea and vomiting.  Musculoskeletal: Positive for arthralgias and gait problem. Negative for joint swelling and myalgias.       Left hip pain   Skin: Negative for pallor and rash.       Left hip incision Left hip purple bruise   Neurological: Negative for dizziness, speech  difficulty, weakness, light-headedness, numbness and headaches.  Psychiatric/Behavioral: Negative for agitation, behavioral problems, confusion and sleep disturbance. The patient is not nervous/anxious.     Immunization History  Administered Date(s) Administered  . Unspecified SARS-COV-2 Vaccination 07/29/2019, 08/19/2019   Pertinent  Health Maintenance Due  Topic Date Due  . COLONOSCOPY  Never done  . PNA vac Low Risk Adult (1 of 2 - PCV13) Never done  . INFLUENZA VACCINE  Never done   No flowsheet data found. Functional Status Survey:    Vitals:   03/11/20 1543  BP: 124/69  Pulse: 81  Resp: 18  Temp: 97.7 F (36.5 C)  SpO2: 94%  Weight: 252 lb 12.8 oz (114.7 kg)  Height: 6\' 3"  (1.905 m)   Body mass index is 31.6 kg/m. Physical Exam Vitals and nursing note reviewed.  Constitutional:      General: He is not in acute distress.    Appearance: He is obese. He is not ill-appearing.  HENT:     Nose: Nose normal. No congestion or rhinorrhea.     Mouth/Throat:     Mouth: Mucous membranes are moist.     Pharynx: Oropharynx is clear. No oropharyngeal exudate or posterior oropharyngeal erythema.  Eyes:     General: No scleral icterus.       Right eye: No discharge.        Left eye: No discharge.     Extraocular Movements: Extraocular movements intact.     Conjunctiva/sclera: Conjunctivae normal.     Pupils: Pupils are equal, round, and reactive to light.  Cardiovascular:     Rate and Rhythm: Normal rate and regular rhythm.     Heart sounds: Normal heart sounds. No murmur heard.  No friction rub. No gallop.      Comments: Unable to palpate bilateral pedal pulse due to lower extremities edema. Negative calf tenderness to palpation.  Pulmonary:     Effort: Pulmonary effort is normal. No respiratory distress.     Breath sounds: Normal breath sounds. No wheezing, rhonchi or rales.  Chest:     Chest wall: No tenderness.  Abdominal:     General: Bowel sounds are normal.  There is no distension.     Palpations: Abdomen is soft. There is no mass.     Tenderness: There is no abdominal tenderness. There is no right CVA tenderness, left CVA tenderness, guarding or rebound.  Musculoskeletal:        General: No swelling or tenderness.     Cervical back: Normal range of motion. No rigidity or tenderness.     Right lower leg: Edema present.     Left lower leg: Edema present.     Comments: Unsteady gait walks with Rolling walker.Bilateral lower extremities 2-3+ pitting edema.   Lymphadenopathy:     Cervical: No cervical adenopathy.  Skin:    General: Skin is warm and dry.     Coloration: Skin is not pale.     Findings: No bruising, erythema, lesion or rash.     Comments: Left hip surgical incision without any redness,swelling or drainage.old purple bruise on hip/gluteal noted.   Neurological:     Mental Status: He is  alert and oriented to person, place, and time.     Cranial Nerves: No cranial nerve deficit.     Sensory: No sensory deficit.     Motor: No weakness.     Coordination: Coordination normal.     Gait: Gait abnormal.  Psychiatric:        Mood and Affect: Mood normal.        Behavior: Behavior normal.        Thought Content: Thought content normal.        Judgment: Judgment normal.     Labs reviewed: Recent Labs    02/19/20 2259 02/21/20 0320 02/22/20 0343  NA 141 137 135  K 4.3 4.6 4.0  CL 106 105 102  CO2 27 25 26   GLUCOSE 133* 134* 106*  BUN 17 16 18   CREATININE 1.06 0.99 0.92  CALCIUM 9.0 8.6* 8.4*    Recent Labs    02/19/20 2259 02/19/20 2259 02/21/20 0320 02/22/20 0343 02/23/20 0403  WBC 10.1   < > 10.5 10.7* 10.9*  NEUTROABS 8.7*  --   --   --   --   HGB 14.2   < > 11.7* 10.8* 11.1*  HCT 43.3   < > 35.5* 33.0* 33.7*  MCV 94.5   < > 95.2 95.7 95.5  PLT 158   < > 143* 131* 144*   < > = values in this interval not displayed.    Significant Diagnostic Results in last 30 days:  CT Head Wo Contrast  Result Date:  02/19/2020 CLINICAL DATA:  Fall while unloading wheelbarrow EXAM: CT HEAD WITHOUT CONTRAST TECHNIQUE: Contiguous axial images were obtained from the base of the skull through the vertex without intravenous contrast. COMPARISON:  None. FINDINGS: Brain: No evidence of acute infarction, hemorrhage, hydrocephalus, extra-axial collection, visible mass lesion or mass effect. Symmetric prominence of the ventricles, cisterns and sulci compatible with parenchymal volume loss. Patchy areas of white matter hypoattenuation are most compatible with chronic microvascular angiopathy. Vascular: Atherosclerotic calcification of the carotid siphons. No hyperdense vessel. Skull: No calvarial fracture or suspicious osseous lesion. No scalp swelling or hematoma. Sinuses/Orbits: Paranasal sinuses and mastoid air cells are predominantly clear. Included orbital structures are unremarkable. Other: None. IMPRESSION: 1. No acute intracranial abnormality. No scalp swelling or calvarial fracture. 2. Mild parenchymal volume loss and chronic microvascular angiopathy changes. Electronically Signed   By: 04/24/20 M.D.   On: 02/19/2020 22:26   Pelvis Portable  Result Date: 02/20/2020 CLINICAL DATA:  ORIF left intertrochanteric fracture. EXAM: PORTABLE PELVIS 1-2 VIEWS COMPARISON:  Concurrent hip exam reported separately. FINDINGS: Intramedullary nail with trans trochanteric screw partially included in the field of view fixating intertrochanteric femur fracture. Recent postsurgical change includes air in the adjacent soft tissues. No other fracture of the pelvis or acute findings. IMPRESSION: ORIF left intertrochanteric femur fracture. No immediate postoperative complication. Electronically Signed   By: 02/21/2020 M.D.   On: 02/20/2020 16:49   DG Chest Port 1 View  Result Date: 02/19/2020 CLINICAL DATA:  Hip fracture after a fall EXAM: PORTABLE CHEST 1 VIEW COMPARISON:  None. FINDINGS: Shallow inspiration. Heart size and pulmonary  vascularity are normal for technique. Vascular crowding or linear atelectasis in the lung bases. No consolidation or edema. No pleural effusions. No pneumothorax. Mediastinal contours appear intact. IMPRESSION: Shallow inspiration with vascular crowding or linear atelectasis in the lung bases. Electronically Signed   By: 02/22/2020 M.D.   On: 02/19/2020 23:02   DG C-Arm 1-60 Min-No Report  Result Date: 02/20/2020 CLINICAL DATA:  Left intramedullary nail. EXAM: OPERATIVE LEFT HIP (WITH PELVIS IF PERFORMED) 2 VIEWS TECHNIQUE: Fluoroscopic spot image(s) were submitted for interpretation post-operatively. COMPARISON:  Preoperative radiograph. FINDINGS: Four fluoroscopic spot images obtained in the operating room. Intramedullary nail with trans trochanteric and distal locking screw traverse intertrochanteric femur fracture. Total fluoroscopy time 1 minutes 12 seconds. Total dose 31.545 mGy. IMPRESSION: Fluoroscopic spot views after ORIF left intertrochanteric fracture. Electronically Signed   By: Narda Rutherford M.D.   On: 02/20/2020 16:48   DG Hip Port Unilat With Pelvis 1V Left  Result Date: 02/20/2020 CLINICAL DATA:  Postop hip surgery today. EXAM: DG HIP (WITH OR WITHOUT PELVIS) 1V PORT LEFT COMPARISON:  Radiograph yesterday. FINDINGS: Intramedullary rod with trans trochanteric and distal locking screw fixation of intertrochanteric proximal femur fracture. The fracture is in improved alignment compared to preoperative imaging. No periprosthetic lucency. Recent postsurgical change includes air and edema in the soft tissues. IMPRESSION: Intramedullary rod and screw fixation of intertrochanteric proximal femur fracture, in improved alignment compared to preoperative imaging. No immediate postoperative complication Electronically Signed   By: Narda Rutherford M.D.   On: 02/20/2020 16:45   DG HIP OPERATIVE UNILAT W OR W/O PELVIS LEFT  Result Date: 02/20/2020 CLINICAL DATA:  Left intramedullary nail.  EXAM: OPERATIVE LEFT HIP (WITH PELVIS IF PERFORMED) 2 VIEWS TECHNIQUE: Fluoroscopic spot image(s) were submitted for interpretation post-operatively. COMPARISON:  Preoperative radiograph. FINDINGS: Four fluoroscopic spot images obtained in the operating room. Intramedullary nail with trans trochanteric and distal locking screw traverse intertrochanteric femur fracture. Total fluoroscopy time 1 minutes 12 seconds. Total dose 31.545 mGy. IMPRESSION: Fluoroscopic spot views after ORIF left intertrochanteric fracture. Electronically Signed   By: Narda Rutherford M.D.   On: 02/20/2020 16:48   DG Hip Unilat With Pelvis 2-3 Views Left  Result Date: 02/19/2020 CLINICAL DATA:  Post fall with left hip pain. EXAM: DG HIP (WITH OR WITHOUT PELVIS) 2-3V LEFT COMPARISON:  None. FINDINGS: Displaced intertrochanteric left femur fracture. Fracture involves the greater trochanter with mild combination. There is proximal migration of the femoral shaft. Femoral head remains seated. Pubic rami are intact. No additional fracture of the pelvis. IMPRESSION: Displaced intertrochanteric left femur fracture. Electronically Signed   By: Narda Rutherford M.D.   On: 02/19/2020 22:28    Assessment/Plan   1. Edema of both lower extremities No abrupt weight gain,cough,wheezing or shortness of breath noted.2-3 + pitting edema on both legs noted.Prolong sitting on wheelchair with legs down could be a contributory factor. - Start on Furosemide 40 mg tablet daily x 3 days then decrease to 20 mg tablet one by mouth daily  - Potassium chloride 20 meq tablet one by mouth daily  - Encourage to lay down in bed and keep legs elevated.  - check BMP in 1 week  - Obtain ABI's if negative wound care Nurse to apply three layer wraps with ACE/Kerlix from base of toes to knee high.change dressing every three days or per facility protocol.   2. Closed fracture of left hip, initial encounter (HCC) Incision healing well without any signs of  infection. - pain well controlled  - continue DVT prophylaxis per Orthopedic orders.  Family/ staff Communication: Reviewed plan of care with patient and Facility Nurse  Labs/tests ordered: check BMP in 1 week  Caesar Bookman, NP

## 2020-03-18 ENCOUNTER — Encounter: Payer: Self-pay | Admitting: Family

## 2020-03-18 ENCOUNTER — Non-Acute Institutional Stay (SKILLED_NURSING_FACILITY): Payer: BC Managed Care – PPO | Admitting: Family

## 2020-03-18 DIAGNOSIS — R2681 Unsteadiness on feet: Secondary | ICD-10-CM | POA: Diagnosis not present

## 2020-03-18 DIAGNOSIS — S72002D Fracture of unspecified part of neck of left femur, subsequent encounter for closed fracture with routine healing: Secondary | ICD-10-CM | POA: Diagnosis not present

## 2020-03-18 DIAGNOSIS — I10 Essential (primary) hypertension: Secondary | ICD-10-CM | POA: Diagnosis not present

## 2020-03-18 DIAGNOSIS — E7800 Pure hypercholesterolemia, unspecified: Secondary | ICD-10-CM | POA: Diagnosis not present

## 2020-03-18 DIAGNOSIS — R600 Localized edema: Secondary | ICD-10-CM

## 2020-03-18 NOTE — Progress Notes (Signed)
[facility] Room Number: 102-P Place of Service:  SNF (31)  Provider: Richarda Bladeinah Alexei Ey FNP-C   [facility]P: [facility] Patient Care Team: [facility] as PCP - General  Extended Emergency Contact Information Primary Emergency Contact: Lawernce Keashillips, Joyce Mobile Phone: [phone] Relation: Sister Preferred language: English Interpreter needed? No  Code Status:Full Code  Goals of care:  Advanced Directive information Advanced Directives 03/18/2020  Does Patient Have a Medical Advance Directive? No  Would patient like information on creating a medical advance directive? No - Patient declined     No Known Allergies  Chief Complaint  Patient presents with   Discharge Note    Discharge from SNF to home 03/19/2020.    HPI:  74 y.o. male [facility] for discharge home with sister 03/19/2020.He was here for short term rehabilitation for post hospital admission from 02/19/2020 - 02/23/2020 for fall while loading the wheelbarrow when he threw a bag into the wheelbarrow which overturned pulling him with it.He landed on his left side and had immediate pain.Also hit his head but did not loss consciousness. EMS was called and [facility] X-rays showed displaced intertrochanteric left femur  fracture.Orhtopedic was consulted and [facility]he underwent intramedullary nail placement on 02/20/2020 with Dr.Landau.Her was stable and was discharge here for rehab.He is on Lovenox for DVT prophylaxis for 30 days.He lives with his sister.   He has had unremarkable stay except has lower extremities edema possible due to prolong sitting on wheelchair does not like to sit on recliner and elevate legs.States has difficult time getting in and out of the recliner.No abrupt weight gain or sings of fluid overload.ABI's obtained were negative. Furosemide 40 mg tablet daily  x 4 days was given then reduce to 20 mg tablet daily was initiated.He states used to take fluid pill and worn compression stockings several years ago.will need follow up for BMP in 1-2 weeks.He states pain under controlled.    He has worked well with PT/OT now stable for discharge home with sister.He will be discharged home with Home health PT/OT to continue with ROM, Exercise, Gait stability and muscle strengthening.He will also need Home health Nursing for medication management.He will require a  3-1 bedside commode with a drop arm. Patient unable to safely and independently perform toileting transfer in home with unsteady gait and left hip pain.Home health services will be arranged by facility social worker prior to discharge. Prescription medication will be written x 1 month then patient to follow up with PCP in 1-2 weeks.He denies any acute issues this visit. Facility staff report no new concerns.   Past Medical History:  Diagnosis Date   Hypertension     Past Surgical History:  Procedure Laterality Date   INTRAMEDULLARY (IM) NAIL INTERTROCHANTERIC Left 02/20/2020   Procedure: INTRAMEDULLARY (IM) NAIL INTERTROCHANTRIC;  Surgeon: Teryl LucyLandau, Joshua, MD;  Location: [facility] ORS;  Service: Orthopedics;  Laterality: Left;      reports that he has never smoked. He has never used smokeless tobacco. He reports that he does not drink alcohol and does not use drugs. Social History   Socioeconomic History   Marital status: Single    Spouse name: Not on file   Number of children: Not on file   Years of education: Not on file   Highest education level: Not on file  Occupational History   Not on file  Tobacco Use   Smoking status: Never Smoker   Smokeless tobacco: Never Used  Substance and Sexual Activity   Alcohol use: Never   Drug use: Never   Sexual activity: Not on file  Other Topics Concern   Not on file  Social History Narrative   Not on file   Social Determinants of Health    Financial Resource Strain:    Difficulty of Paying Living Expenses: Not on file  Food Insecurity:    Worried About Running Out of Food in the Last Year: Not on file   Ran Out of Food in the Last Year: Not on file  Transportation Needs:    Lack of Transportation (Medical): Not on file   Lack of Transportation (Non-Medical): Not on file  Physical Activity:    Days of Exercise per Week: Not on file   Minutes of Exercise per Session: Not on file  Stress:    Feeling of Stress : Not on file  Social Connections:    Frequency of Communication with Friends and Family: Not on file   Frequency of Social Gatherings with Friends and Family: Not on file   Attends Religious Services: Not on file   Active Member of Clubs or Organizations: Not on file   Attends Banker Meetings: Not on file   Marital Status: Not on file  Intimate Partner Violence:    Fear of Current or Ex-Partner: Not on file   Emotionally Abused: Not on file   Physically Abused: Not on file   Sexually Abused: Not on file   Functional Status Survey:    No Known Allergies  Pertinent  Health Maintenance Due  Topic Date Due   COLONOSCOPY  Never done   PNA vac Low Risk Adult (1 of 2 - PCV13) Never done   INFLUENZA VACCINE  Never done    Medications: Outpatient Encounter Medications as of 03/18/2020  Medication Sig   enoxaparin (LOVENOX) 40 MG/0.4ML injection Inject 0.4 mLs (40 mg total) into the skin daily.   ferrous sulfate 325 (65 FE) MG tablet Take 1 tablet (325 mg total) by mouth 2 (two) times daily with a meal.   furosemide (LASIX) 20 MG tablet Take 20 mg by mouth daily.   magnesium hydroxide (MILK OF MAGNESIA) 400 MG/5ML suspension If no BM in 3 days, give 30 cc Milk of Magnesium p.o. x 1 dose in 24 hours as needed (Do not use standing constipation orders for residents with renal failure CFR less than 30. Contact MD for orders) (Physician Order)   nebivolol (BYSTOLIC) 10 MG  tablet Take 10 mg by mouth daily.   polyethylene glycol (MIRALAX / GLYCOLAX) 17 g packet Take 17 g by mouth daily.   potassium chloride (KLOR-CON) 10 MEQ tablet Take 10 mEq by mouth daily.   Sodium Phosphates (RA SALINE ENEMA RE) Place rectally as needed.   Vitamin D, Ergocalciferol, (DRISDOL) 1.25 MG (50000 UNIT) CAPS capsule Take 50,000 Units by mouth once a week.   rosuvastatin (CRESTOR) 40 MG tablet Take 40 mg by mouth at bedtime.   No facility-administered encounter medications on file as of 03/18/2020.     Review of Systems  Constitutional: Negative for appetite change, chills, fatigue and fever.  HENT: Negative for congestion, rhinorrhea, sinus pressure, sinus pain, sneezing, sore throat and trouble swallowing.   Eyes: Negative for discharge, redness, itching and visual disturbance.  Respiratory: Negative for cough, chest tightness, shortness of breath and wheezing.   Cardiovascular: Positive for leg swelling. Negative  for chest pain and palpitations.  Gastrointestinal: Negative for abdominal distention, abdominal pain, constipation, diarrhea, nausea and vomiting.  Endocrine: Negative for cold intolerance, heat intolerance, polydipsia, polyphagia and polyuria.  Genitourinary: Negative for difficulty urinating, dysuria, flank pain, frequency and urgency.  Musculoskeletal: Positive for arthralgias and gait problem. Negative for joint swelling and myalgias.  Skin: Negative for color change, pallor and rash.       Left hip incision   Neurological: Negative for dizziness, speech difficulty, weakness, light-headedness, numbness and headaches.  Hematological: Does not bruise/bleed easily.  Psychiatric/Behavioral: Negative for agitation, confusion and sleep disturbance. The patient is not nervous/anxious.     Vitals:   03/18/20 1457  BP: 125/75  Pulse: 86  Resp: 17  Temp: 97.8 F (36.6 C)  Weight: 252 lb (114.3 kg)  Height:  (1.905 m)   Body mass index is 31.5  kg/m. Physical Exam Vitals and nursing note reviewed.  Constitutional:      General: He is not in acute distress.    Appearance: He is obese. He is not ill-appearing.  HENT:     Head: Normocephalic.     Nose: Nose normal. No congestion or rhinorrhea.     Mouth/Throat:     Mouth: Mucous membranes are moist.     Pharynx: Oropharynx is clear. No oropharyngeal exudate or posterior oropharyngeal erythema.  Eyes:     General: No scleral icterus.       Right eye: No discharge.        Left eye: No discharge.     Extraocular Movements: Extraocular movements intact.     Conjunctiva/sclera: Conjunctivae normal.     Pupils: Pupils are equal, round, and reactive to light.  Cardiovascular:     Rate and Rhythm: Normal rate and regular rhythm.     Pulses: Normal pulses.     Heart sounds: Normal heart sounds. No murmur heard.  No friction rub. No gallop.   Pulmonary:     Effort: Pulmonary effort is normal. No respiratory distress.     Breath sounds: Normal breath sounds. No wheezing, rhonchi or rales.  Chest:     Chest wall: No tenderness.  Abdominal:     General: Bowel sounds are normal. There is no distension.     Palpations: Abdomen is soft. There is no mass.     Tenderness: There is no abdominal tenderness. There is no right CVA tenderness, left CVA tenderness, guarding or rebound.  Musculoskeletal:        General: No swelling or tenderness.     Cervical back: Normal range of motion. No rigidity or tenderness.     Comments: Unsteady gait ambulates with rolling walker.bilateral lower extremities edema has improved.   Lymphadenopathy:     Cervical: No cervical adenopathy.  Skin:    General: Skin is warm and dry.     Coloration: Skin is not pale.     Findings: No bruising, erythema or rash.     Comments: Left hip incision healed without any signs of infection   Neurological:     Mental Status: He is alert and oriented to person, place, and time.     Cranial Nerves: No cranial nerve  deficit.     Sensory: No sensory deficit.     Motor: No weakness.     Coordination: Coordination normal.     Gait: Gait abnormal.  Psychiatric:        Mood and Affect: Mood normal.        Behavior: Behavior  normal.        Thought Content: Thought content normal.        Judgment: Judgment normal.     Labs reviewed: Basic Metabolic Panel: Recent Labs    02/19/20 2259 02/21/20 0320 02/22/20 0343  NA 141 137 135  K 4.3 4.6 4.0  CL 106 105 102  CO2 27 25 26   GLUCOSE 133* 134* 106*  BUN 17 16 18   CREATININE 1.06 0.99 0.92  CALCIUM 9.0 8.6* 8.4*   CBC: Recent Labs    02/19/20 2259 02/19/20 2259 02/21/20 0320 02/22/20 0343 02/23/20 0403  WBC 10.1   < > 10.5 10.7* 10.9*  NEUTROABS 8.7*  --   --   --   --   HGB 14.2   < > 11.7* 10.8* 11.1*  HCT 43.3   < > 35.5* 33.0* 33.7*  MCV 94.5   < > 95.2 95.7 95.5  PLT 158   < > 143* 131* 144*   < > = values in this interval not displayed.    Procedures and Imaging Studies During Stay: CT Head Wo Contrast  Result Date: 02/19/2020 CLINICAL DATA:  Fall while unloading wheelbarrow EXAM: CT HEAD WITHOUT CONTRAST TECHNIQUE: Contiguous axial images were obtained from the base of the skull through the vertex without intravenous contrast. COMPARISON:  None. FINDINGS: Brain: No evidence of acute infarction, hemorrhage, hydrocephalus, extra-axial collection, visible mass lesion or mass effect. Symmetric prominence of the ventricles, cisterns and sulci compatible with parenchymal volume loss. Patchy areas of white matter hypoattenuation are most compatible with chronic microvascular angiopathy. Vascular: Atherosclerotic calcification of the carotid siphons. No hyperdense vessel. Skull: No calvarial fracture or suspicious osseous lesion. No scalp swelling or hematoma. Sinuses/Orbits: Paranasal sinuses and mastoid air cells are predominantly clear. Included orbital structures are unremarkable. Other: None. IMPRESSION: 1. No acute intracranial  abnormality. No scalp swelling or calvarial fracture. 2. Mild parenchymal volume loss and chronic microvascular angiopathy changes. Electronically Signed   By: 04/24/20 M.D.   On: 02/19/2020 22:26   Pelvis Portable  Result Date: 02/20/2020 CLINICAL DATA:  ORIF left intertrochanteric fracture. EXAM: PORTABLE PELVIS 1-2 VIEWS COMPARISON:  Concurrent hip exam reported separately. FINDINGS: Intramedullary nail with trans trochanteric screw partially included in the field of view fixating intertrochanteric femur fracture. Recent postsurgical change includes air in the adjacent soft tissues. No other fracture of the pelvis or acute findings. IMPRESSION: ORIF left intertrochanteric femur fracture. No immediate postoperative complication. Electronically Signed   By: 02/21/2020 M.D.   On: 02/20/2020 16:49   DG Chest Port 1 View  Result Date: 02/19/2020 CLINICAL DATA:  Hip fracture after a fall EXAM: PORTABLE CHEST 1 VIEW COMPARISON:  None. FINDINGS: Shallow inspiration. Heart size and pulmonary vascularity are normal for technique. Vascular crowding or linear atelectasis in the lung bases. No consolidation or edema. No pleural effusions. No pneumothorax. Mediastinal contours appear intact. IMPRESSION: Shallow inspiration with vascular crowding or linear atelectasis in the lung bases. Electronically Signed   By: 02/22/2020 M.D.   On: 02/19/2020 23:02   DG C-Arm 1-60 Min-No Report  Result Date: 02/20/2020 CLINICAL DATA:  Left intramedullary nail. EXAM: OPERATIVE LEFT HIP (WITH PELVIS IF PERFORMED) 2 VIEWS TECHNIQUE: Fluoroscopic spot image(s) were submitted for interpretation post-operatively. COMPARISON:  Preoperative radiograph. FINDINGS: Four fluoroscopic spot images obtained in the operating room. Intramedullary nail with trans trochanteric and distal locking screw traverse intertrochanteric femur fracture. Total fluoroscopy time 1 minutes 12 seconds. Total dose 31.545 mGy. IMPRESSION:  Fluoroscopic  spot views after ORIF left intertrochanteric fracture. Electronically Signed   By: Narda Rutherford M.D.   On: 02/20/2020 16:48   DG Hip Port Unilat With Pelvis 1V Left  Result Date: 02/20/2020 CLINICAL DATA:  Postop hip surgery today. EXAM: DG HIP (WITH OR WITHOUT PELVIS) 1V PORT LEFT COMPARISON:  Radiograph yesterday. FINDINGS: Intramedullary rod with trans trochanteric and distal locking screw fixation of intertrochanteric proximal femur fracture. The fracture is in improved alignment compared to preoperative imaging. No periprosthetic lucency. Recent postsurgical change includes air and edema in the soft tissues. IMPRESSION: Intramedullary rod and screw fixation of intertrochanteric proximal femur fracture, in improved alignment compared to preoperative imaging. No immediate postoperative complication Electronically Signed   By: Narda Rutherford M.D.   On: 02/20/2020 16:45   DG HIP OPERATIVE UNILAT W OR W/O PELVIS LEFT  Result Date: 02/20/2020 CLINICAL DATA:  Left intramedullary nail. EXAM: OPERATIVE LEFT HIP (WITH PELVIS IF PERFORMED) 2 VIEWS TECHNIQUE: Fluoroscopic spot image(s) were submitted for interpretation post-operatively. COMPARISON:  Preoperative radiograph. FINDINGS: Four fluoroscopic spot images obtained in the operating room. Intramedullary nail with trans trochanteric and distal locking screw traverse intertrochanteric femur fracture. Total fluoroscopy time 1 minutes 12 seconds. Total dose 31.545 mGy. IMPRESSION: Fluoroscopic spot views after ORIF left intertrochanteric fracture. Electronically Signed   By: Narda Rutherford M.D.   On: 02/20/2020 16:48   DG Hip Unilat With Pelvis 2-3 Views Left  Result Date: 02/19/2020 CLINICAL DATA:  Post fall with left hip pain. EXAM: DG HIP (WITH OR WITHOUT PELVIS) 2-3V LEFT COMPARISON:  None. FINDINGS: Displaced intertrochanteric left femur fracture. Fracture involves the greater trochanter with mild combination. There is proximal  migration of the femoral shaft. Femoral head remains seated. Pubic rami are intact. No additional fracture of the pelvis. IMPRESSION: Displaced intertrochanteric left femur fracture. Electronically Signed   By: Narda Rutherford M.D.   On: 02/19/2020 22:28    Assessment/Plan:     1. Unsteady gait Has worked well with PT/ OT.He will discharge home PT/OT to continue with ROM, Exercise, Gait stability and muscle strengthening. He require a 3-1 bedside commode with a drop arm to enable him to safely and independently perform toileting transfer in home with unsteady gait and left hip pain. Fall and safety precautions.  2. Closed fracture of left hip with routine healing, subsequent encounter Status post fall with left displaced intertrochanteric left femur fracture status post intramedullary nail placement on 02/20/2020 with Dr.Landau.continue on Lovenox for DVT prophylaxis for 30 days. - Home health Nurse for medical management. - pain well controlled. - continue to follow up with Orthopedic as directed  -  CBC, BMP in 1-2 weeks PCP   3. Benign essential hypertension B/p well controlled. - continue on nebivolol and Furosemide   4. Pure hypercholesterolemia No Lipid panel for evaluation will defer to PCP  - continue on rosuvastatin 40 mg tablet daily at bedtime.No mylagia or muscle weakness reported.   5. Edema of both lower extremities Has improved on furosemide.continue on Potassium chloride. - encouraged to keep legs elevated while seated. - BMP in 1-2 weeks PCP   Patient is being discharged with the following home health services:   -PT/OT for ROM, exercise, gait stability and muscle strengthening  -  HH RN for Medication management  Patient is being discharged with the following durable medical equipment:   - 3 in 1 commode   Patient has been advised to f/u with their PCP in 1-2 weeks to for a transitions of care  visit.Social services at their facility was responsible for arranging  this appointment.  Pt was provided with adequate prescriptions of noncontrolled medications to reach the scheduled appointment.For controlled substances, a limited supply was provided as appropriate for the individual patient. If the pt normally receives these medications from a pain clinic or has a contract with another physician, these medications should be received from that clinic or physician only).    Future labs/tests needed:  CBC, BMP in 1-2 weeks PCP

## 2020-03-22 ENCOUNTER — Other Ambulatory Visit: Payer: Self-pay | Admitting: Family

## 2020-03-22 ENCOUNTER — Telehealth: Payer: Self-pay

## 2020-03-22 MED ORDER — FUROSEMIDE 20 MG PO TABS
20.0000 mg | ORAL_TABLET | Freq: Every day | ORAL | 0 refills | Status: DC
Start: 2020-03-22 — End: 2020-06-28

## 2020-03-22 MED ORDER — ENOXAPARIN SODIUM 40 MG/0.4ML ~~LOC~~ SOLN: 40.0000 mg | SUBCUTANEOUS | 0 refills | Status: AC

## 2020-03-22 MED ORDER — NEBIVOLOL HCL 10 MG PO TABS: 10.0000 mg | ORAL_TABLET | Freq: Every day | ORAL | 0 refills | Status: AC

## 2020-03-22 MED ORDER — POTASSIUM CHLORIDE ER 10 MEQ PO TBCR
10.0000 meq | EXTENDED_RELEASE_TABLET | Freq: Every day | ORAL | 0 refills | Status: DC
Start: 2020-03-22 — End: 2020-06-28

## 2020-03-22 MED ORDER — POTASSIUM CHLORIDE ER 10 MEQ PO TBCR: 10.0000 meq | EXTENDED_RELEASE_TABLET | Freq: Every day | ORAL | 0 refills | Status: DC

## 2020-03-22 MED ORDER — ROSUVASTATIN CALCIUM 40 MG PO TABS: 40.0000 mg | ORAL_TABLET | Freq: Every day | ORAL | 0 refills | Status: DC

## 2020-03-22 MED ORDER — FERROUS SULFATE 325 (65 FE) MG PO TABS: 325.0000 mg | ORAL_TABLET | Freq: Two times a day (BID) | ORAL | 0 refills | Status: DC

## 2020-03-22 MED ORDER — POLYETHYLENE GLYCOL 3350 17 G PO PACK: 17.0000 g | PACK | Freq: Every day | ORAL | 0 refills | Status: AC

## 2020-03-22 MED ORDER — NEBIVOLOL HCL 10 MG PO TABS
10.0000 mg | ORAL_TABLET | Freq: Every day | ORAL | 0 refills | Status: DC
Start: 2020-03-22 — End: 2020-03-22

## 2020-03-22 MED ORDER — VITAMIN D (ERGOCALCIFEROL) 1.25 MG (50000 UNIT) PO CAPS
50000.0000 [IU] | ORAL_CAPSULE | ORAL | 0 refills | Status: DC
Start: 2020-03-22 — End: 2020-06-28

## 2020-03-22 MED ORDER — VITAMIN D (ERGOCALCIFEROL) 1.25 MG (50000 UNIT) PO CAPS: 50000.0000 [IU] | ORAL_CAPSULE | ORAL | 0 refills | Status: DC

## 2020-03-22 MED ORDER — FERROUS SULFATE 325 (65 FE) MG PO TABS: 325.0000 mg | ORAL_TABLET | Freq: Two times a day (BID) | ORAL | 0 refills | Status: AC

## 2020-03-22 MED ORDER — ROSUVASTATIN CALCIUM 40 MG PO TABS: 40.0000 mg | ORAL_TABLET | Freq: Every day | ORAL | 0 refills | Status: AC

## 2020-03-22 MED ORDER — FUROSEMIDE 20 MG PO TABS
20.0000 mg | ORAL_TABLET | Freq: Every day | ORAL | 0 refills | Status: DC
Start: 2020-03-22 — End: 2020-03-22

## 2020-03-22 NOTE — Telephone Encounter (Signed)
Medication re-send to pharmacy.Orders for Home health PT/OT and Nursing was ordered during discharged.Social worker arranged for home health. Okay for Nurse to wrap legs.

## 2020-03-22 NOTE — Telephone Encounter (Signed)
Patient was discharged on Monday 03/18/2020 with no medication refills. He is home now and would like to know can we fill his meds until he goes back to his primary Dr. Also Heidi Copland would like a verbal for Home Health  To come and and wrap the wounds for the patient which it was already given in written form, but she said she like to also have a verbal. I refilled medication for 30 days. Please advise.

## 2020-03-22 NOTE — Telephone Encounter (Signed)
Verbal orders given  

## 2020-04-17 ENCOUNTER — Other Ambulatory Visit: Payer: Self-pay | Admitting: Family

## 2020-05-07 ENCOUNTER — Other Ambulatory Visit: Payer: Self-pay | Admitting: Family

## 2020-05-13 ENCOUNTER — Other Ambulatory Visit (HOSPITAL_BASED_OUTPATIENT_CLINIC_OR_DEPARTMENT_OTHER): Payer: Self-pay | Admitting: Internal Medicine

## 2020-05-13 ENCOUNTER — Ambulatory Visit: Payer: BC Managed Care – PPO | Attending: Internal Medicine

## 2020-05-13 DIAGNOSIS — Z23 Encounter for immunization: Secondary | ICD-10-CM

## 2020-05-13 NOTE — Progress Notes (Signed)
   Covid-19 Vaccination Clinic  Name:  Jeramia Saleeby    MRN: [id number] DOB: 1946/01/29  05/13/2020  Mr. Menken was observed post Covid-19 immunization for 15 minutes without incident. He was provided with Vaccine Information Sheet and instruction to access the V-Safe system.   Mr. Weidler was instructed to call 911 with any severe reactions post vaccine: Marland Kitchen Difficulty breathing  . Swelling of face and throat  . A fast heartbeat  . A bad rash all over body  . Dizziness and weakness   Immunizations Administered    Name Date Dose VIS Date Route   Pfizer COVID-19 Vaccine 05/13/2020 11:41 AM 0.3 mL 03/13/2020 Intramuscular   Manufacturer: [employer], [employer]   Lot: [id number]   NDC: [id number]

## 2020-05-15 MED FILL — PFIZER-BIONTECH COVID-19 VA: 30 | 1 days supply | Qty: 0 | Fill #0

## 2020-05-31 ENCOUNTER — Other Ambulatory Visit: Payer: Self-pay | Admitting: Family

## 2020-06-10 ENCOUNTER — Other Ambulatory Visit: Payer: Self-pay | Admitting: Family

## 2020-06-28 ENCOUNTER — Other Ambulatory Visit: Payer: Self-pay | Admitting: Family

## 2020-06-28 NOTE — Telephone Encounter (Signed)
rx sent to pharmacy by e-script  

## 2020-07-09 ENCOUNTER — Other Ambulatory Visit: Payer: Self-pay | Admitting: Family

## 2020-07-24 ENCOUNTER — Other Ambulatory Visit: Payer: Self-pay | Admitting: Family

## 2020-08-11 ENCOUNTER — Other Ambulatory Visit: Payer: Self-pay | Admitting: Family

## 2020-09-09 ENCOUNTER — Ambulatory Visit: Payer: BC Managed Care – PPO

## 2020-09-09 NOTE — Progress Notes (Signed)
   Covid-19 Vaccination Clinic  Name:  Spencer [name]    MRN: [id number] DOB: 02/17/1946  09/09/2020  Spencer [name] was observed post Covid-19 immunization for 15 minutes without incident. He was provided with Vaccine Information Sheet and instruction to access the V-Safe system. Administered by Lynann Beaver, RPh.  Spencer [name] was instructed to call 911 with any severe reactions post vaccine: Marland Kitchen Difficulty breathing  . Swelling of face and throat  . A fast heartbeat  . A bad rash all over body  . Dizziness and weakness

## 2020-09-21 ENCOUNTER — Other Ambulatory Visit: Payer: Self-pay | Admitting: Family

## 2020-12-03 ENCOUNTER — Other Ambulatory Visit: Payer: Self-pay | Admitting: Family

## 2021-03-15 IMAGING — RF DG HIP (WITH PELVIS) OPERATIVE*L*
1 series · 4 of 4 positions shown · non-contrast
Comparison: Preoperative radiograph.

CLINICAL DATA: Left intramedullary nail.

EXAM:
OPERATIVE LEFT HIP (WITH PELVIS IF PERFORMED) 2 VIEWS
TECHNIQUE: Fluoroscopic spot image(s) were submitted for interpretation
post-operatively.

[Series 1: unknown protocol · 0.14mm/px · 4 of 4 slices shown]
[im 1/4]
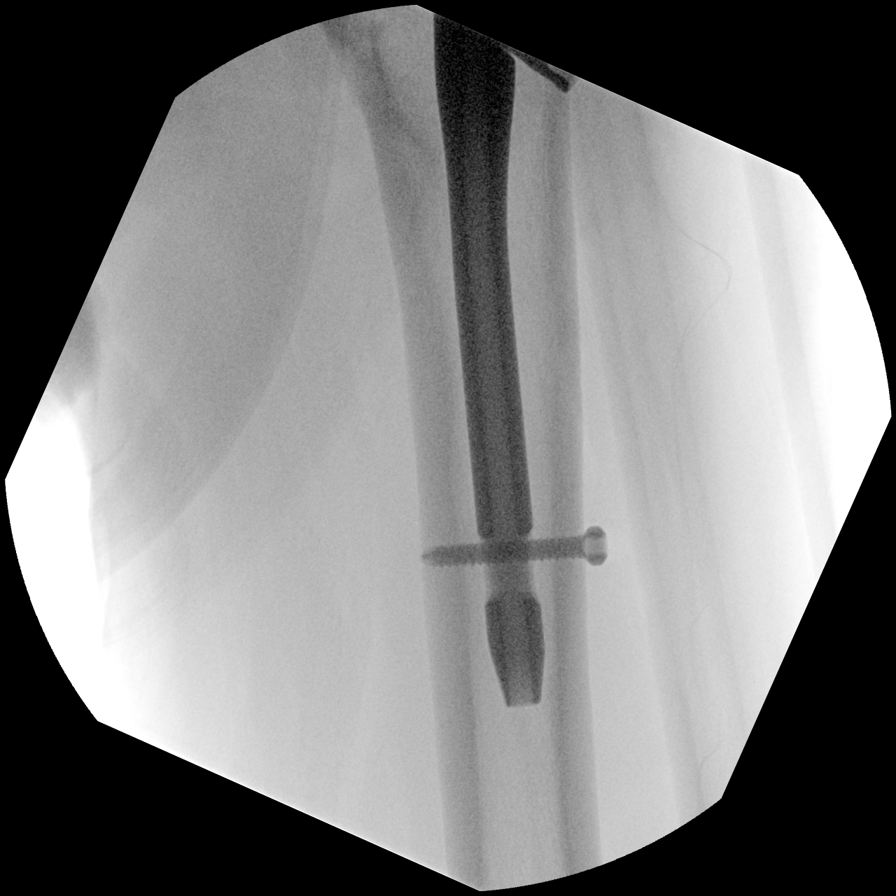
[im 2/4]
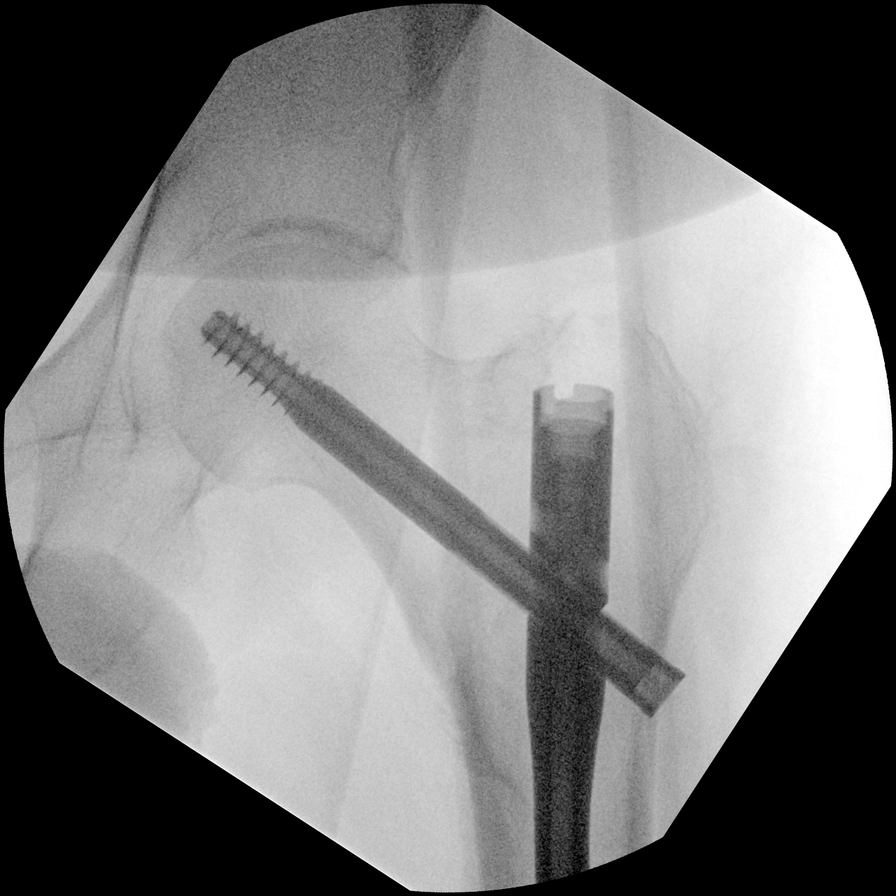
[im 3/4]
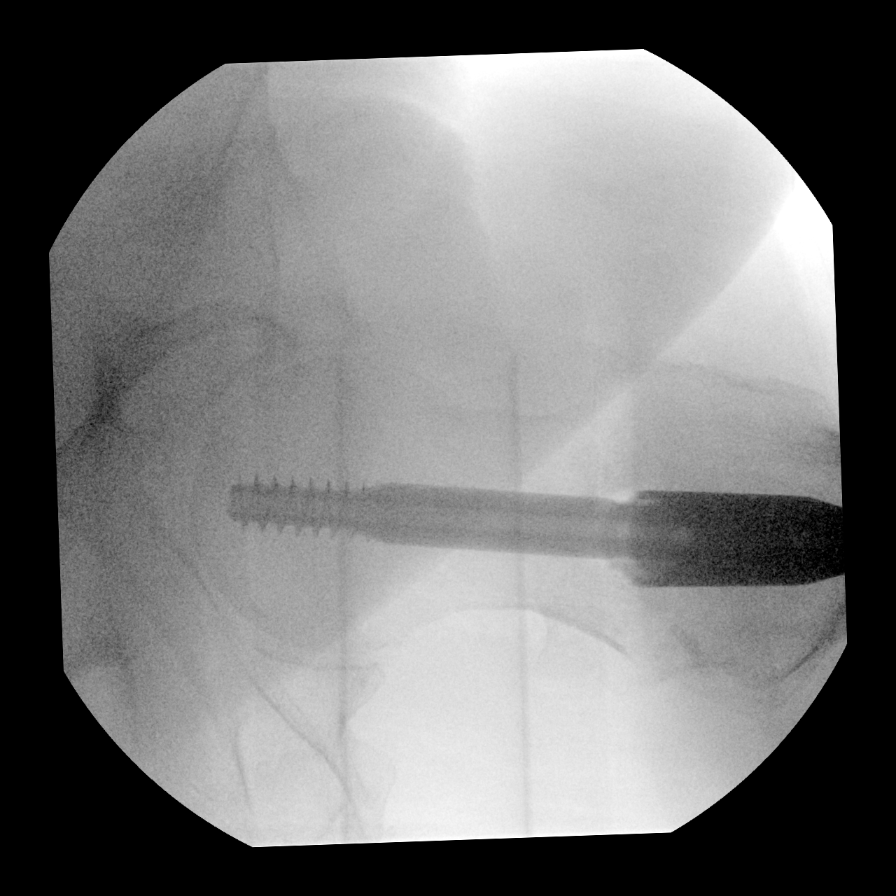
[im 4/4]
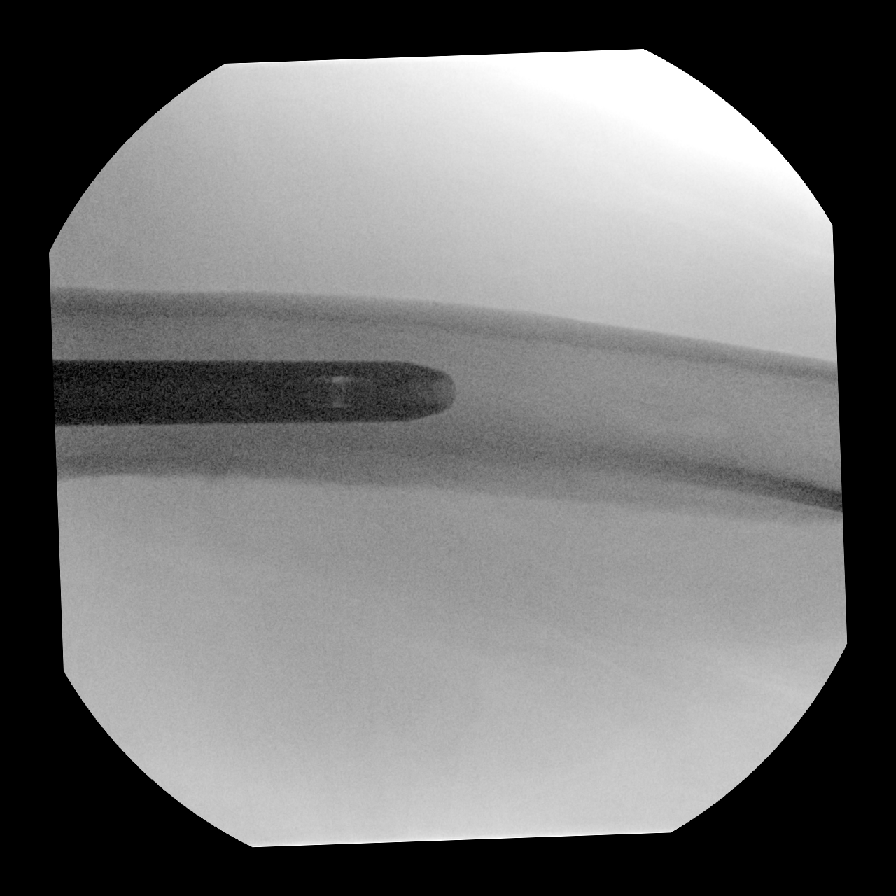

[4 of 4 positions shown; findings below may reference images not displayed]

FINDINGS: Four fluoroscopic spot images obtained in the operating room.
Intramedullary nail with trans trochanteric and distal locking screw
traverse intertrochanteric femur fracture. Total fluoroscopy time 1
minutes 12 seconds. Total dose 31.545 mGy.
IMPRESSION: Fluoroscopic spot views after ORIF left intertrochanteric fracture.

## 2021-03-15 IMAGING — DX DG PORTABLE PELVIS
1 series · 1 of 1 positions shown · non-contrast
Comparison: Concurrent hip exam reported separately.

CLINICAL DATA: ORIF left intertrochanteric fracture.

EXAM:
PORTABLE PELVIS 1-2 VIEWS

[pelvis ap]
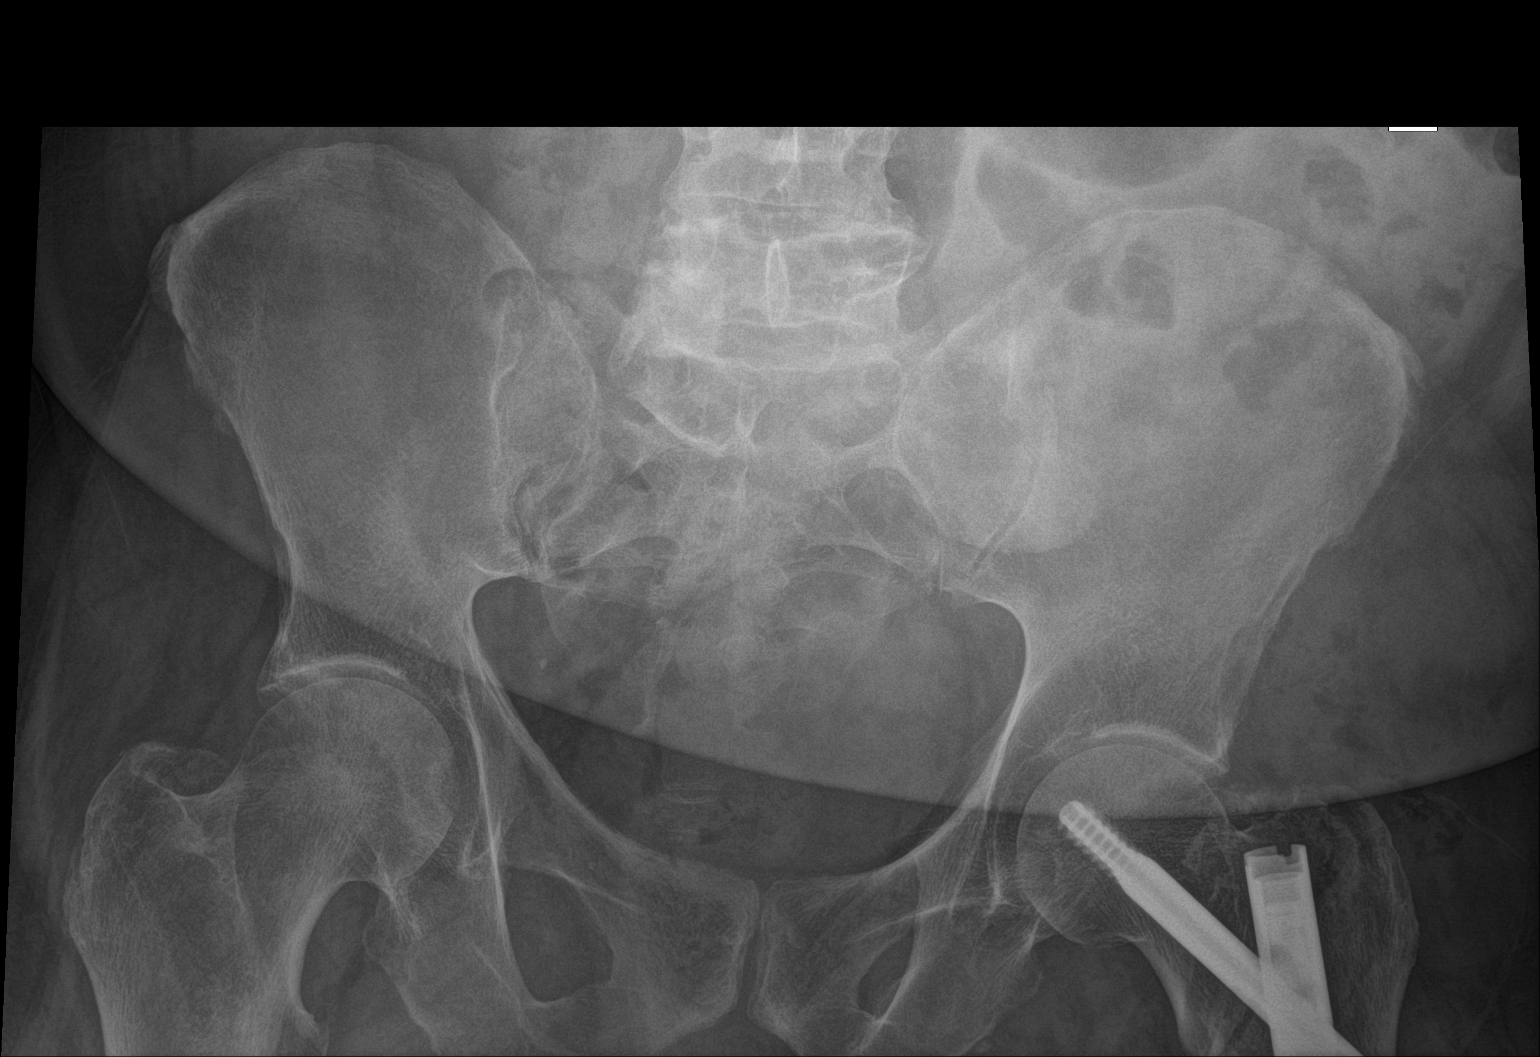

[1 of 1 positions shown; findings below may reference images not displayed]

FINDINGS: Intramedullary nail with trans trochanteric screw partially included
in the field of view fixating intertrochanteric femur fracture.
Recent postsurgical change includes air in the adjacent soft
tissues. No other fracture of the pelvis or acute findings.
IMPRESSION: ORIF left intertrochanteric femur fracture. No immediate
postoperative complication.
